# Patient Record
Sex: Female | Born: 1975
Health system: Southern US, Community
[De-identification: ages and names within clinical notes are randomized; demographics above are authoritative.]

## PROBLEM LIST (undated history)

## (undated) DIAGNOSIS — I10 Essential (primary) hypertension: Secondary | ICD-10-CM

## (undated) DIAGNOSIS — E7800 Pure hypercholesterolemia, unspecified: Secondary | ICD-10-CM

## (undated) DIAGNOSIS — F329 Major depressive disorder, single episode, unspecified: Secondary | ICD-10-CM

## (undated) DIAGNOSIS — F32A Depression, unspecified: Secondary | ICD-10-CM

## (undated) HISTORY — DX: Depression, unspecified: F32.A

## (undated) HISTORY — PX: LAPAROSCOPIC OVARIAN: SHX5906

---
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
## 2004-11-27 ENCOUNTER — Observation Stay: Payer: Self-pay

## 2004-12-01 ENCOUNTER — Inpatient Hospital Stay: Payer: Self-pay | Admitting: Unknown Physician Specialty

## 2005-11-11 ENCOUNTER — Emergency Department: Payer: Self-pay | Admitting: Emergency Medicine

## 2006-01-26 ENCOUNTER — Ambulatory Visit: Payer: Self-pay | Admitting: Urology

## 2007-08-30 ENCOUNTER — Emergency Department: Payer: Self-pay | Admitting: Emergency Medicine

## 2007-11-01 ENCOUNTER — Ambulatory Visit: Payer: Self-pay | Admitting: Unknown Physician Specialty

## 2007-11-04 ENCOUNTER — Ambulatory Visit: Payer: Self-pay | Admitting: Unknown Physician Specialty

## 2008-04-16 IMAGING — CT CT STONE STUDY
1 of 2 series · 15 of 32 positions shown, 19 images · non-contrast
Comparison: none

REASON FOR EXAM: ABDOMINAL PAIN  RM 6
COMMENTS:

[Series 2: soft tissue · axial · 0.79mm/px · z∈[-798,-402]mm · 15 of 149 slices shown, 19 images]
[im 11/149  soft-tissue]
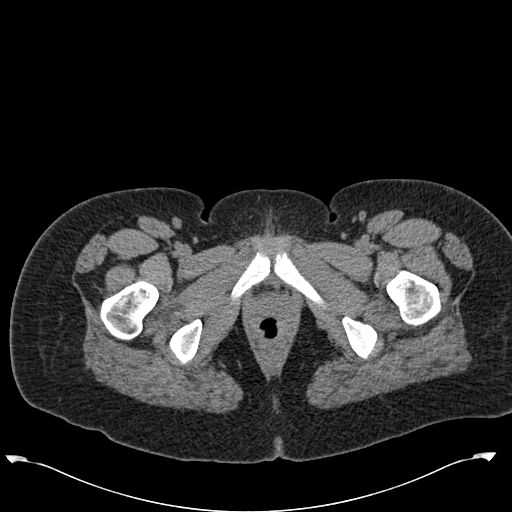
[im 11/149  bone]
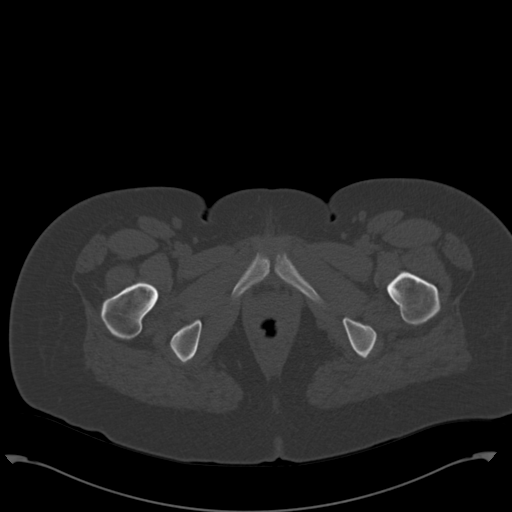
[im 21/149  soft-tissue]
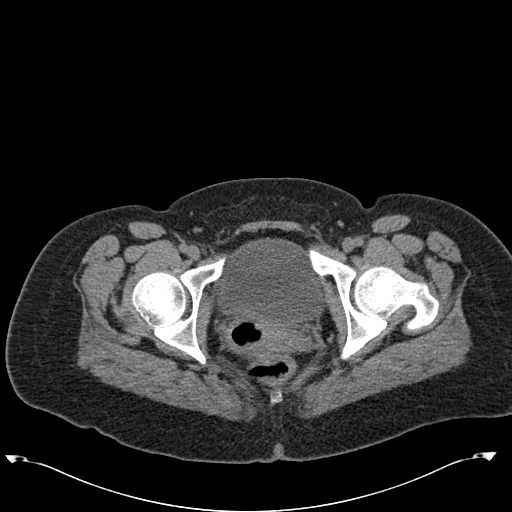
[im 31/149  soft-tissue]
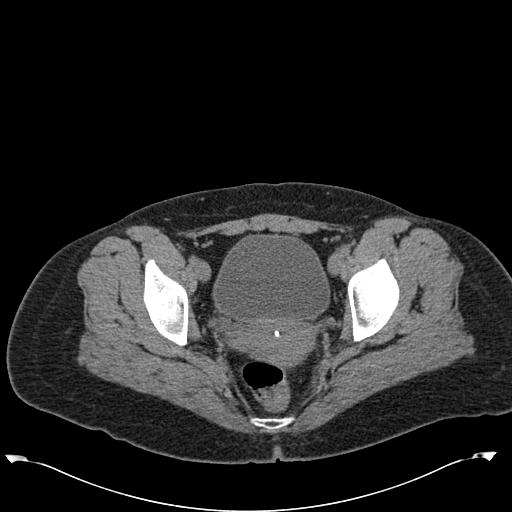
[im 41/149  soft-tissue]
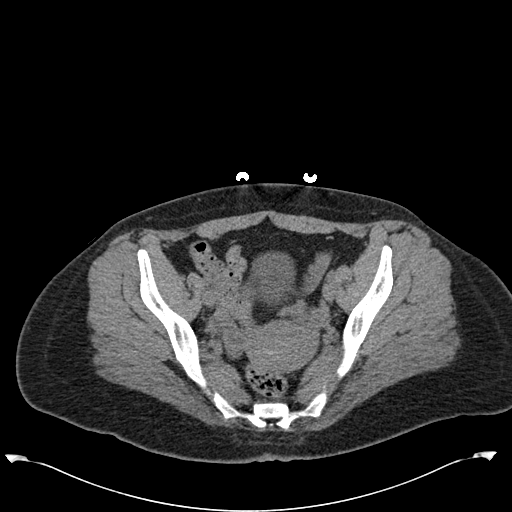
[im 52/149  soft-tissue]
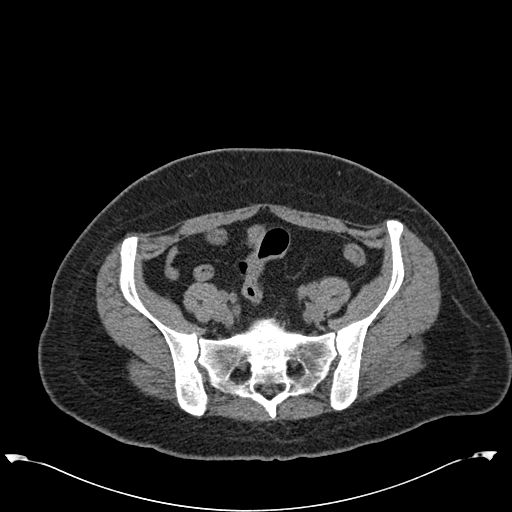
[im 62/149  soft-tissue]
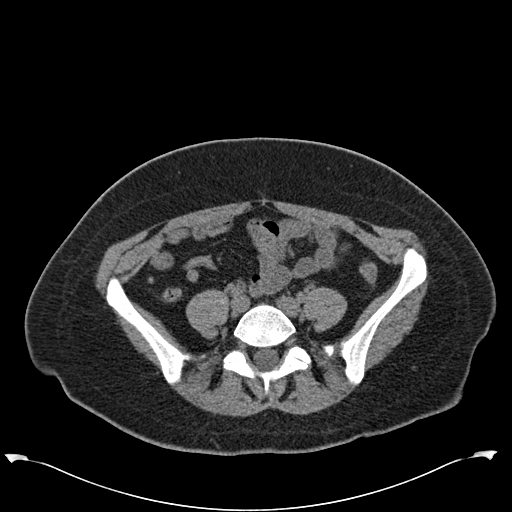
[im 77/149  soft-tissue]
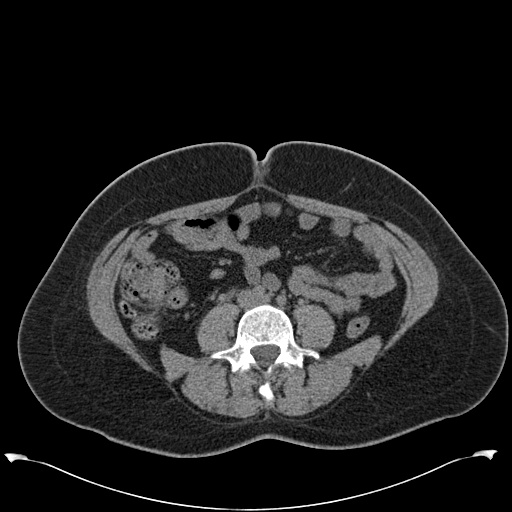
[im 87/149  soft-tissue]
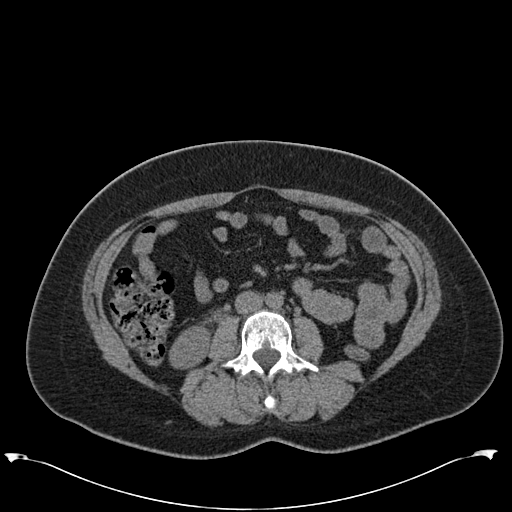
[im 97/149  soft-tissue]
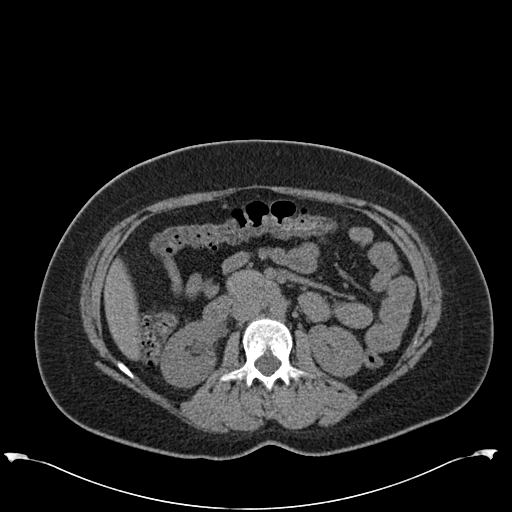
[im 97/149  bone]
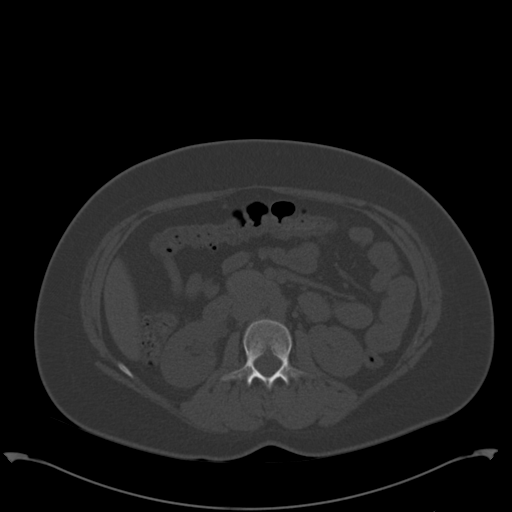
[im 108/149  soft-tissue]
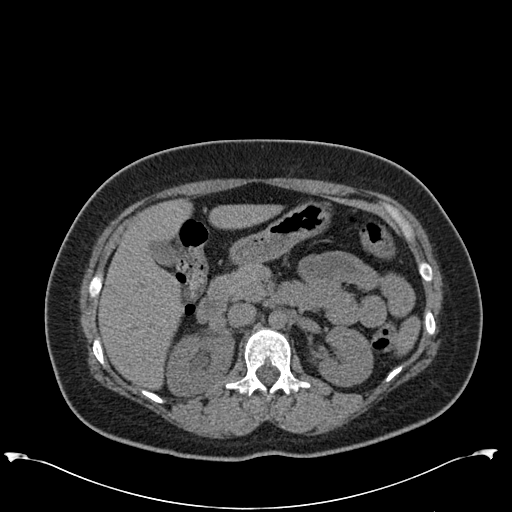
[im 118/149  soft-tissue]
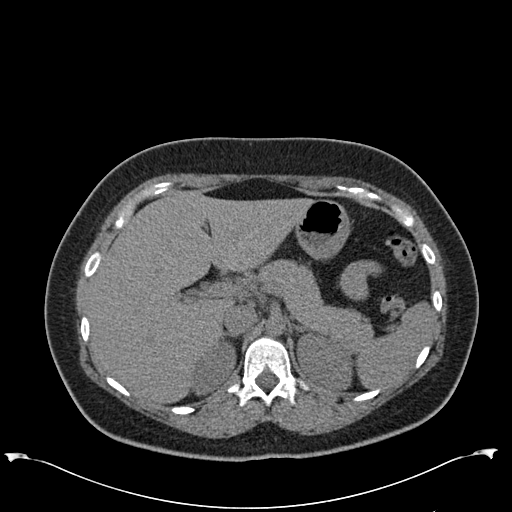
[im 128/149  soft-tissue]
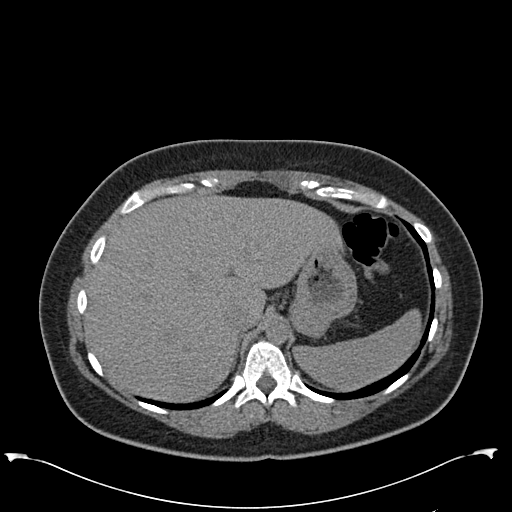
[im 128/149  lung]
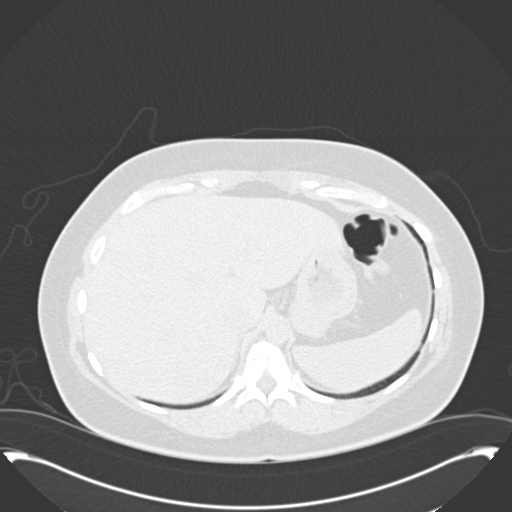
[im 133/149  lung]
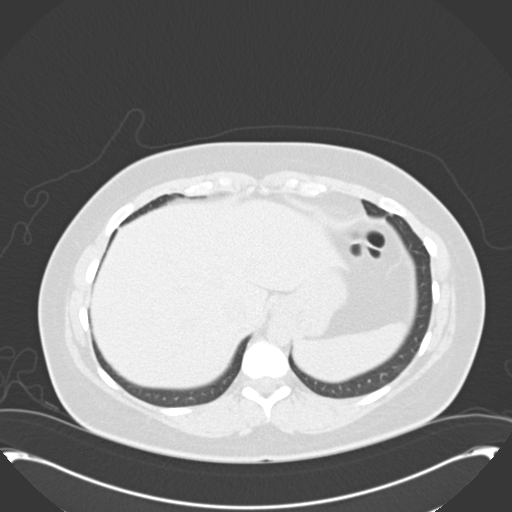
[im 138/149  soft-tissue]
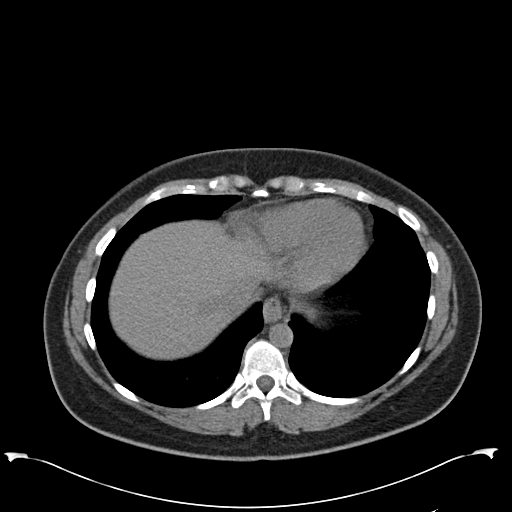
[im 138/149  lung]
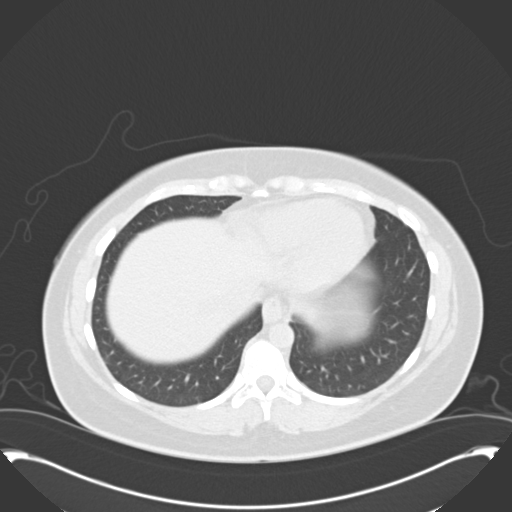
[im 143/149  lung]
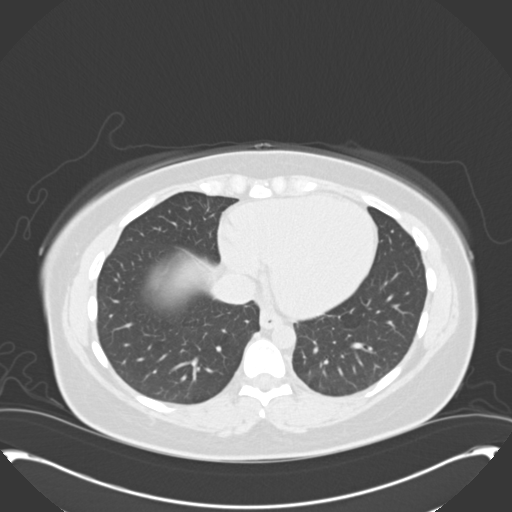

[15 of 32 positions shown; findings below may reference images not displayed]

PROCEDURE:     CT  - CT ABDOMEN /PELVIS WO (STONE)  - November 11, 2005  [DATE]

RESULT:     The patient is complaining of RIGHT flank discomfort and the
study was tailored to evaluate the patient for possible urinary tract
stones.

Both RIGHT and LEFT kidneys demonstrate the presence of stones measuring
between 1 and 3 mm in diameter.  There is mild hydroureter on the RIGHT
secondary to an approximately 2 mm diameter stone seen best on image #128 at
the ureterovesical junction.  I see no stone within the urinary bladder.
There is an IUD device present in the uterus. I see no free fluid in the
pelvis. No adnexal masses are identified. There are a few phleboliths
present.  There is likely a cyst involving the RIGHT ovary.

The unopacified loops of small and large bowel are normal in appearance. The
periaortic and pericaval regions exhibit no acute abnormality. The adrenal
glands, spleen, pancreas, non-distended stomach, and liver exhibit no acute
abnormality. The gallbladder is only partially distended but is grossly
normal. The lung bases are clear.

There is a hypodensity associated with the upper pole of the LEFT kidney
posteriorly with Hounsfield measurement of approximately -23.  This may
reflect fat or a cystic process. No obstruction of the LEFT kidney is seen.
IMPRESSION: 1)There is very mild hydronephrosis and hydroureter on the RIGHT secondary
to a 2 mm diameter stone at the RIGHT ureterovesical junction.

2)Both kidneys demonstrate non-obstructing stones elsewhere measuring
between 1 and 3 mm in diameter.

3)There is a probable RIGHT ovarian cyst.  There is an IUD device in place.
A tampon is also in place.

4)I see no acute abnormality of the bowel nor evidence of acute
hepatobiliary disease.

The findings were called to the [HOSPITAL] the conclusion of
the study.

## 2011-01-20 ENCOUNTER — Ambulatory Visit: Payer: Self-pay | Admitting: Internal Medicine

## 2011-10-27 DIAGNOSIS — E785 Hyperlipidemia, unspecified: Secondary | ICD-10-CM

## 2011-10-27 DIAGNOSIS — I10 Essential (primary) hypertension: Secondary | ICD-10-CM | POA: Insufficient documentation

## 2011-10-27 HISTORY — DX: Hyperlipidemia, unspecified: E78.5

## 2013-01-10 ENCOUNTER — Observation Stay: Payer: Self-pay

## 2013-01-10 LAB — PIH PROFILE
Anion Gap: 9 (ref 7–16)
Creatinine: 1.07 mg/dL (ref 0.60–1.30)
EGFR (African American): >60
Glucose: 79 mg/dL (ref 65–99)
HCT: 32.9 % — ABNORMAL LOW (ref 35.0–47.0)
MCH: 29.7 pg (ref 26.0–34.0)
Osmolality: 279 (ref 275–301)
Platelet: 277 10*3/uL (ref 150–440)
Potassium: 3.5 mmol/L (ref 3.5–5.1)
RBC: 3.8 10*6/uL (ref 3.80–5.20)
Sodium: 141 mmol/L (ref 136–145)
Uric Acid: 8.4 mg/dL — ABNORMAL HIGH (ref 2.6–6.0)
WBC: 10.4 10*3/uL (ref 3.6–11.0)

## 2013-01-10 LAB — PROTEIN / CREATININE RATIO, URINE
Creatinine, Urine: 50.8 mg/dL
Protein, Random Urine: 9 mg/dL
Protein/Creat. Ratio: 177 mg/g{creat}

## 2013-01-13 ENCOUNTER — Inpatient Hospital Stay: Payer: Self-pay | Admitting: Obstetrics and Gynecology

## 2013-06-25 IMAGING — US TRANSABDOMINAL ULTRASOUND OF PELVIS
1 series · 17 of 25 positions shown · non-contrast
Comparison: none

REASON FOR EXAM: RT ovarian cyst
COMMENTS:

[Series 1: transabdominal ultrasound of pelvis · 17 of 78 slices shown]
[im 1/78]
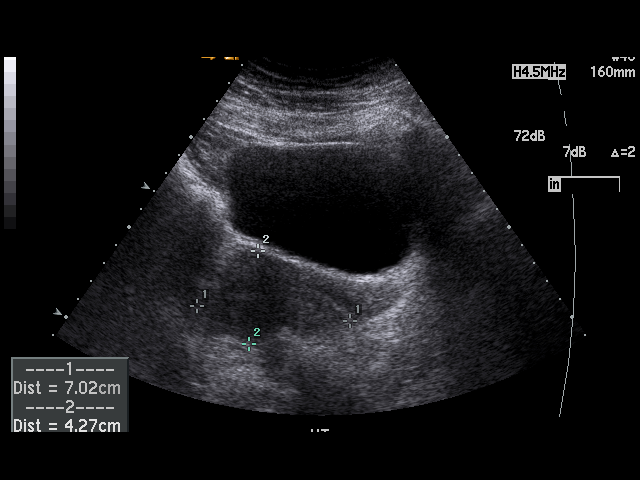
[im 7/78]
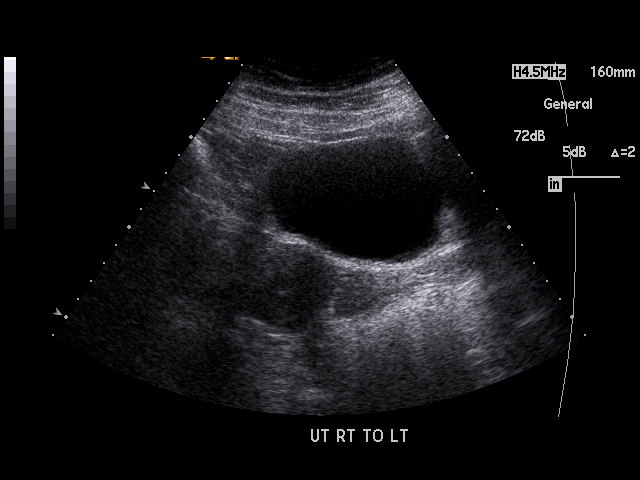
[im 10/78]
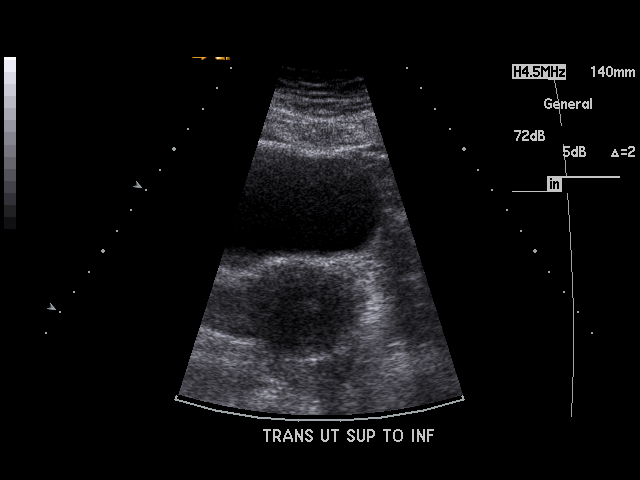
[im 17/78]
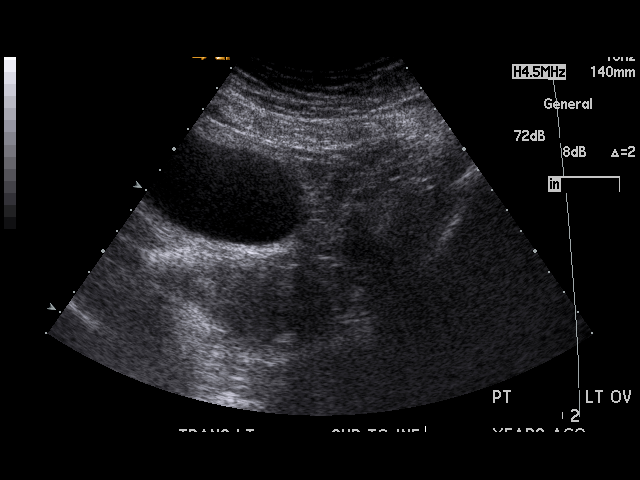
[im 20/78]
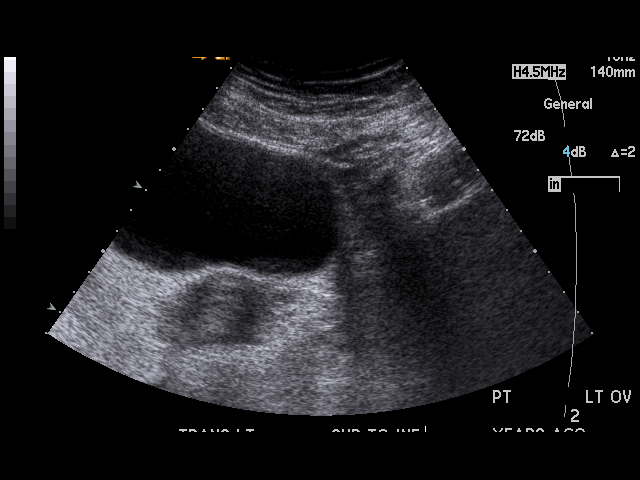
[im 26/78]
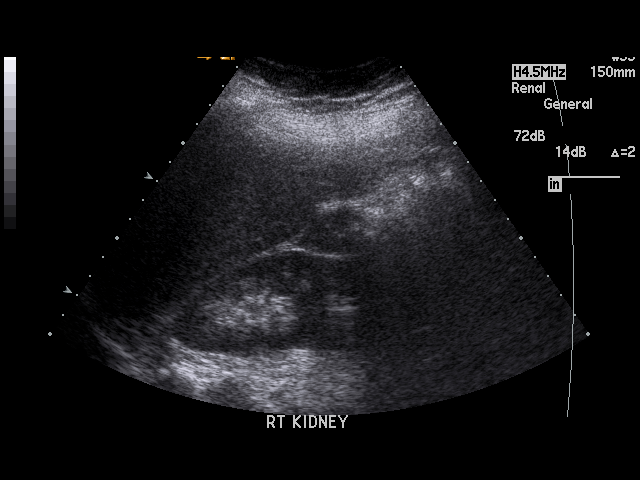
[im 29/78]
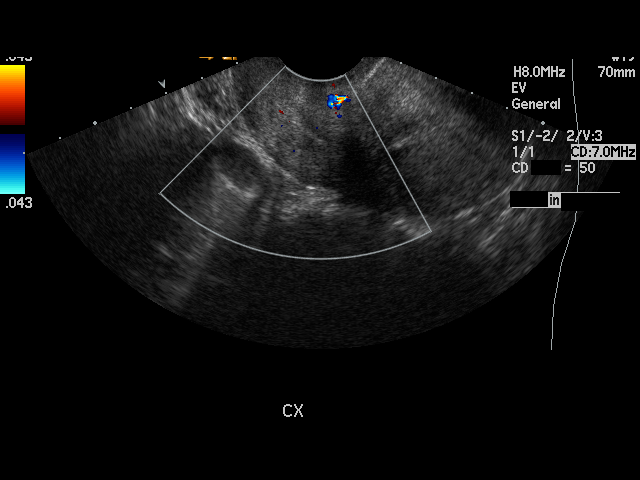
[im 36/78]
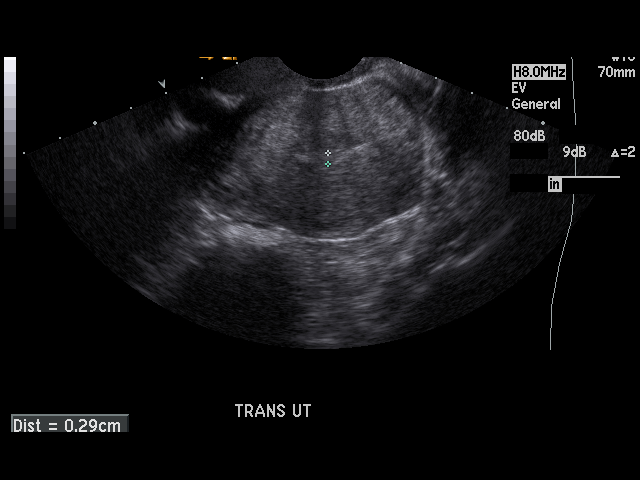
[im 39/78]
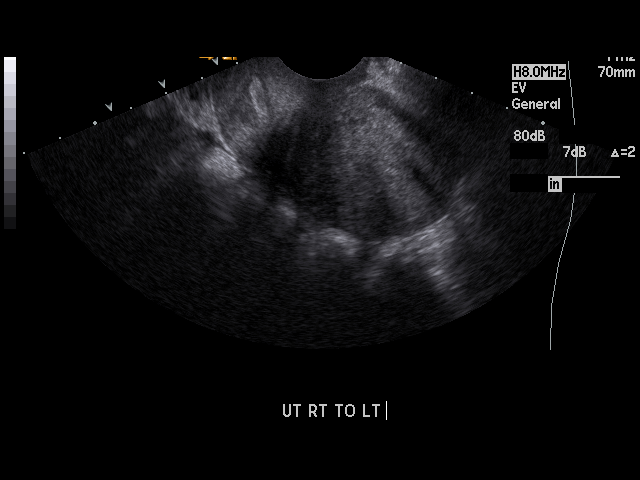
[im 42/78]
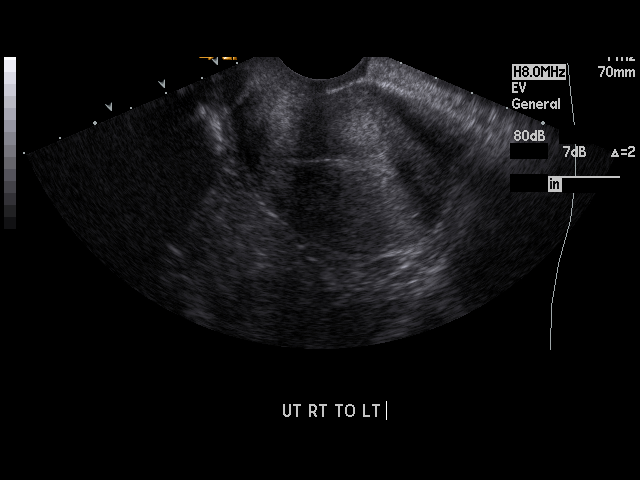
[im 49/78]
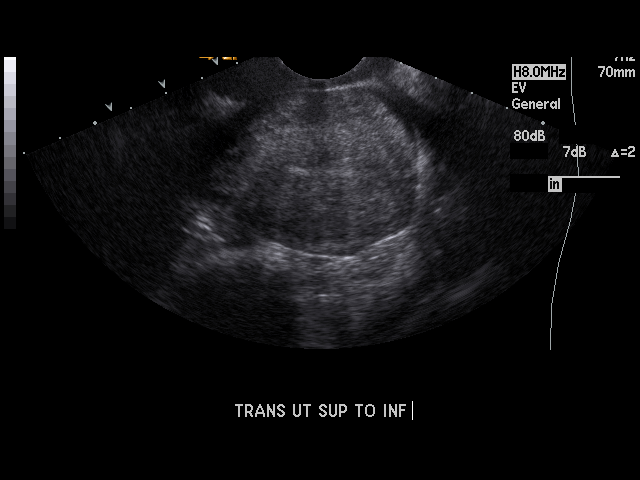
[im 52/78]
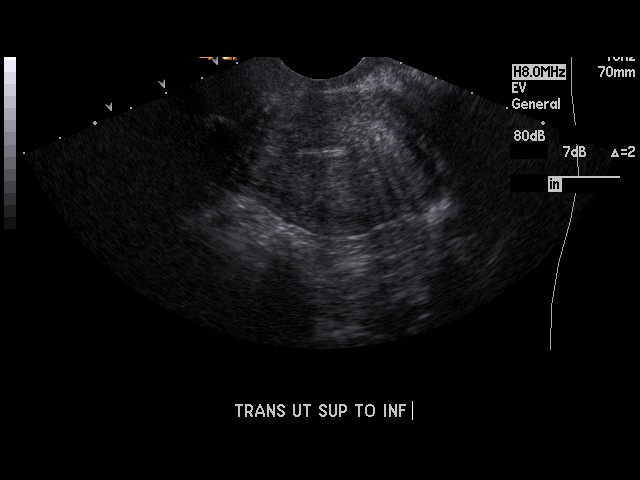
[im 58/78]
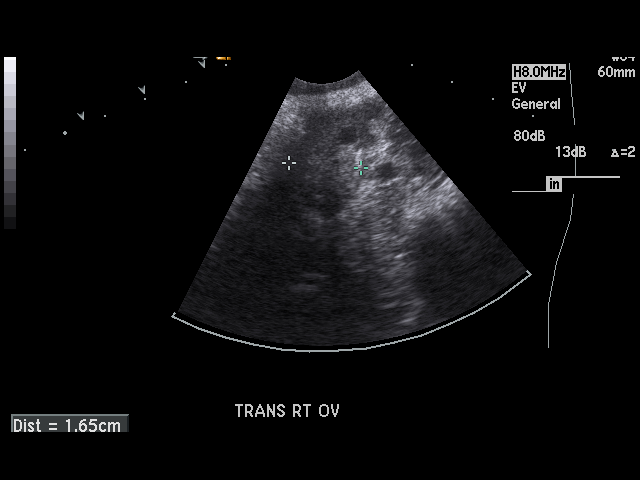
[im 61/78]
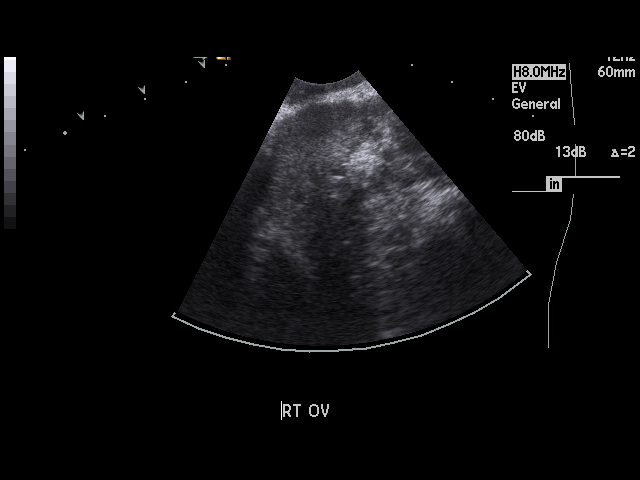
[im 68/78]
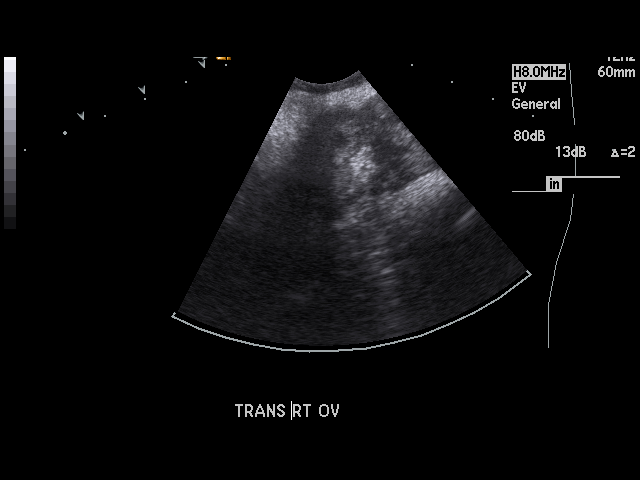
[im 71/78]
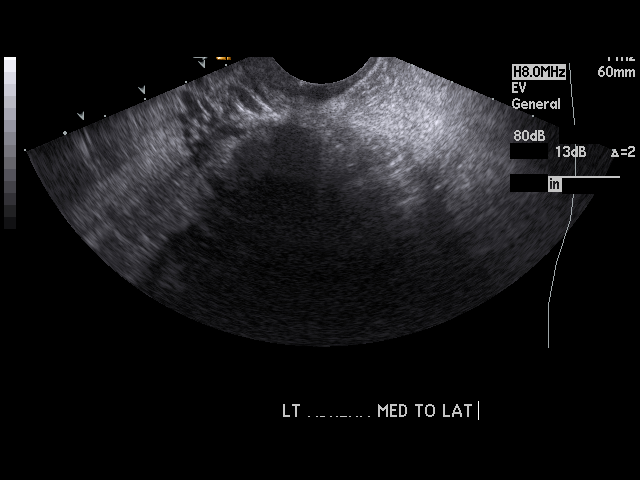
[im 78/78]
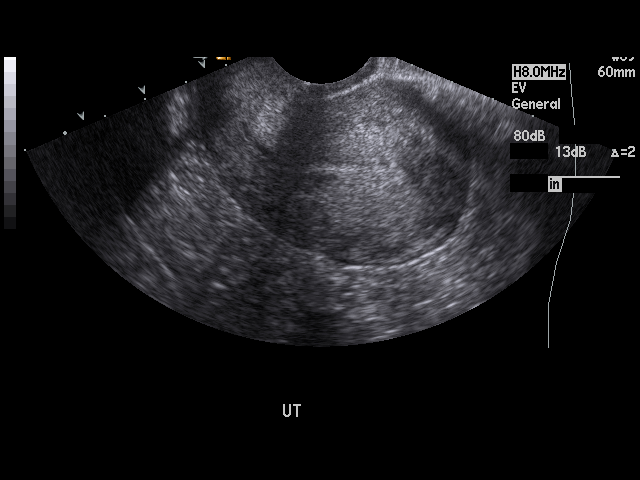

[17 of 25 positions shown; findings below may reference images not displayed]

PROCEDURE:     ANISI - ANISI PELVIS NON-OB W/TRANSVAGINAL  - January 20, 2011  [DATE]

RESULT:     Transabdominal and endovaginal imaging of the pelvis was
obtained.

The uterus measures 7.02 x 4.21 x 5.16 cm and demonstrates a homogeneous
echotexture and endometrial thickness of 3.4 mm. The left ovary has been
surgically removed. The right ovary measures 3.08 x 2.12 x 1.15 cm. The
right ovary demonstrates a echotexture. Color flow is appreciated within the
right ovary. There is no evidence of pelvic free fluid, loculated fluid or
loculated fluid collections or masses.

The uterus is retroverted.
IMPRESSION: Unremarkable pelvic ultrasound.

## 2014-11-14 NOTE — H&P (Signed)
L&D Evaluation:  History Expanded:  HPI 39 yo G3 P2002, with EDD of 01/19/13, [facility] with elevated BPs at office. Denies HA, visual changes, swelling, RUQ pain. Irregular ctx. No LOF or VB. +FM.   Patient's Medical History CHTN - resolved during pregnancy, obesity, AMA   Patient's Surgical History LSO, cystectomy   Medications Pre Serbiaatal Vitamins   Social History erythromycin   ROS:  ROS see HPI   Exam:  Vital Signs stable   General no apparent distress   Mental Status clear   Chest clear   Heart no murmur/gallop/rubs   Abdomen gravid, non-tender   Estimated Fetal Weight Average for gestational age   Edema no edema   Reflexes 1+   Clonus negative   Pelvic no external lesions, 2/60/-2, posterior   Mebranes Intact   FHT normal rate with no decels, category 1 tracing   Ucx irregular   Other Labs: H&H: 11.3 & 32.9, Plt 277, Uric Acid 8.4, SGOT 13, PC Ratio: 177   Impression:  Impression evaluation for PIH   Plan:  Comments Normotensive with rest - will begin maternity leave now. Return to office on 7/9 for BP check & discuss IOL d/t CHTN. Pre-e & labor precautions.   Electronic Signatures: Vella KohlerBrothers, Mahalia Dykes K (CNM)  (Signed 07-Jul-14 13:07)  Authored: L&D Evaluation   Last Updated: 07-Jul-14 13:07 by Vella KohlerBrothers, Eliott Amparan K (CNM)

## 2017-03-27 DIAGNOSIS — E669 Obesity, unspecified: Secondary | ICD-10-CM

## 2017-03-27 DIAGNOSIS — F332 Major depressive disorder, recurrent severe without psychotic features: Secondary | ICD-10-CM

## 2017-03-27 DIAGNOSIS — Z0000 Encounter for general adult medical examination without abnormal findings: Secondary | ICD-10-CM

## 2017-03-27 HISTORY — DX: Major depressive disorder, recurrent severe without psychotic features: F33.2

## 2017-03-27 HISTORY — DX: Obesity, unspecified: E66.9

## 2017-03-27 HISTORY — DX: Encounter for general adult medical examination without abnormal findings: Z00.00

## 2017-04-06 DIAGNOSIS — F902 Attention-deficit hyperactivity disorder, combined type: Secondary | ICD-10-CM

## 2017-04-06 HISTORY — DX: Attention-deficit hyperactivity disorder, combined type: F90.2

## 2018-08-11 ENCOUNTER — Encounter: Payer: Self-pay | Admitting: Physician Assistant

## 2018-08-11 ENCOUNTER — Ambulatory Visit (INDEPENDENT_AMBULATORY_CARE_PROVIDER_SITE_OTHER): Payer: Self-pay | Admitting: Physician Assistant

## 2018-08-11 VITALS — BP 150/98 | HR 86 | Temp 98.3°F | Resp 18 | Wt 224.0 lb

## 2018-08-11 DIAGNOSIS — I10 Essential (primary) hypertension: Secondary | ICD-10-CM

## 2018-08-11 MED ORDER — AMLODIPINE BESYLATE 5 MG PO TABS: 5.0000 mg | ORAL_TABLET | Freq: Every day | ORAL | 0 refills | Status: DC

## 2018-08-11 NOTE — Patient Instructions (Addendum)
Managing Your Hypertension   Start amlodipine 5mg  daily. We do not obtain blood work in our office or provide additional refills for blood pressure medication so you will have to establish with a family doctor for this.   I do recommend frequent monitoring of blood pressure outside of the office. It is best if you check the blood pressure at different times in the day. Your goal is <140/90. If your values are consistently above this goal, please go to urgent care.   If you start to have chest pain, blurred vision, shortness of breath, severe headache, lower leg swelling, or nausea/vomiting please seek care immediately at the ED.     Hypertension is commonly called high blood pressure. This is when the force of your blood pressing against the walls of your arteries is too strong. Arteries are blood vessels that carry blood from your heart throughout your body. Hypertension forces the heart to work harder to pump blood, and may cause the arteries to become narrow or stiff. Having untreated or uncontrolled hypertension can cause heart attack, stroke, kidney disease, and other problems. What are blood pressure readings? A blood pressure reading consists of a higher number over a lower number. Ideally, your blood pressure should be below 120/80. The first ("top") number is called the systolic pressure. It is a measure of the pressure in your arteries as your heart beats. The second ("bottom") number is called the diastolic pressure. It is a measure of the pressure in your arteries as the heart relaxes. What does my blood pressure reading mean? Blood pressure is classified into four stages. Based on your blood pressure reading, your health care provider may use the following stages to determine what type of treatment you need, if any. Systolic pressure and diastolic pressure are measured in a unit called mm Hg. Normal  Systolic pressure: below 120.  Diastolic pressure: below 80. Elevated  Systolic  pressure: 120-129.  Diastolic pressure: below 80. Hypertension stage 1  Systolic pressure: 130-139.  Diastolic pressure: 80-89. Hypertension stage 2  Systolic pressure: 140 or above.  Diastolic pressure: 90 or above. What health risks are associated with hypertension? Managing your hypertension is an important responsibility. Uncontrolled hypertension can lead to:  A heart attack.  A stroke.  A weakened blood vessel (aneurysm).  Heart failure.  Kidney damage.  Eye damage.  Metabolic syndrome.  Memory and concentration problems. What changes can I make to manage my hypertension? Hypertension can be managed by making lifestyle changes and possibly by taking medicines. Your health care provider will help you make a plan to bring your blood pressure within a normal range. Eating and drinking   Eat a diet that is high in fiber and potassium, and low in salt (sodium), added sugar, and fat. An example eating plan is called the DASH (Dietary Approaches to Stop Hypertension) diet. To eat this way: ? Eat plenty of fresh fruits and vegetables. Try to fill half of your plate at each meal with fruits and vegetables. ? Eat whole grains, such as whole wheat pasta, brown rice, or whole grain bread. Fill about one quarter of your plate with whole grains. ? Eat low-fat diary products. ? Avoid fatty cuts of meat, processed or cured meats, and poultry with skin. Fill about one quarter of your plate with lean proteins such as fish, chicken without skin, beans, eggs, and tofu. ? Avoid premade and processed foods. These tend to be higher in sodium, added sugar, and fat.  Reduce your daily  sodium intake. Most people with hypertension should eat less than 1,500 mg of sodium a day.  Limit alcohol intake to no more than 1 drink a day for nonpregnant women and 2 drinks a day for men. One drink equals 12 oz of beer, 5 oz of wine, or 1 oz of hard liquor. Lifestyle  Work with your health care  provider to maintain a healthy body weight, or to lose weight. Ask what an ideal weight is for you.  Get at least 30 minutes of exercise that causes your heart to beat faster (aerobic exercise) most days of the week. Activities may include walking, swimming, or biking.  Include exercise to strengthen your muscles (resistance exercise), such as weight lifting, as part of your weekly exercise routine. Try to do these types of exercises for 30 minutes at least 3 days a week.  Do not use any products that contain nicotine or tobacco, such as cigarettes and e-cigarettes. If you need help quitting, ask your health care provider.  Control any long-term (chronic) conditions you have, such as high cholesterol or diabetes. Monitoring  Monitor your blood pressure at home as told by your health care provider. Your personal target blood pressure may vary depending on your medical conditions, your age, and other factors.  Have your blood pressure checked regularly, as often as told by your health care provider. Working with your health care provider  Review all the medicines you take with your health care provider because there may be side effects or interactions.  Talk with your health care provider about your diet, exercise habits, and other lifestyle factors that may be contributing to hypertension.  Visit your health care provider regularly. Your health care provider can help you create and adjust your plan for managing hypertension. Will I need medicine to control my blood pressure? Your health care provider may prescribe medicine if lifestyle changes are not enough to get your blood pressure under control, and if:  Your systolic blood pressure is 130 or higher.  Your diastolic blood pressure is 80 or higher. Take medicines only as told by your health care provider. Follow the directions carefully. Blood pressure medicines must be taken as prescribed. The medicine does not work as well when you  skip doses. Skipping doses also puts you at risk for problems. Contact a health care provider if:  You think you are having a reaction to medicines you have taken.  You have repeated (recurrent) headaches.  You feel dizzy.  You have swelling in your ankles.  You have trouble with your vision. Get help right away if:  You develop a severe headache or confusion.  You have unusual weakness or numbness, or you feel faint.  You have severe pain in your chest or abdomen.  You vomit repeatedly.  You have trouble breathing. Summary  Hypertension is when the force of blood pumping through your arteries is too strong. If this condition is not controlled, it may put you at risk for serious complications.  Your personal target blood pressure may vary depending on your medical conditions, your age, and other factors. For most people, a normal blood pressure is less than 120/80.  Hypertension is managed by lifestyle changes, medicines, or both. Lifestyle changes include weight loss, eating a healthy, low-sodium diet, exercising more, and limiting alcohol. This information is not intended to replace advice given to you by your health care provider. Make sure you discuss any questions you have with your health care provider. Document Released: 03/17/2012  Document Revised: 05/21/2016 Document Reviewed: 05/21/2016 Elsevier Interactive Patient Education  [employer].

## 2018-08-11 NOTE — Progress Notes (Signed)
MRN: [id number] DOB: 10-04-1975  Subjective:   Molly Summers is a 43 y.o. female presenting for follow up on Hypertension.   She was referred to [place] by health at work for elevated bp reading of 162/101 earlier today. She has a known PMH of HTN, was taking amlodipine 5mg  but stopped in 03/2018 because she lost her insurance and did not want to find a new family doctor.  She has been checking her blood pressure at home regularly over the past few months.  It typically is running in the 150s systolically. Denies lightheadedness, dizziness, chronic headache, double vision, chest pain, shortness of breath, heart racing, palpitations, nausea, vomiting, abdominal pain, hematuria, lower leg swelling. Lifestyle: Trying to focus on a low-carb diet.  Avoiding excessive salt intake.  Has not started exercising but is interested in starting to go to the gym.  Wants to lose weight before she goes to [employer].  Denies smoking.  Denies past medical history of heart disease, MI, stroke, thyroid disorder, diabetes, and kidney disease.  Has PSH of uterine ablation, does not have menstrual cycles anymore.  Has family history of hypertension in multiple family members.  Denies any other aggravating or relieving factors, no other questions or concerns.  Molly Summers has a current medication list which includes the following prescription(s): aripiprazole. Also has no allergies on file.  Molly Summers  has no past medical history on file. Also  has no past surgical history on file.   Objective:   Vitals: BP (!) 155/98 (BP Location: Right Arm, Patient Position: Sitting, Cuff Size: Large)   Pulse 86   Temp 98.3 F (36.8 C) (Oral)   Resp 18   Wt 224 lb (101.6 kg)   SpO2 98%   Physical Exam Vitals signs reviewed.  Constitutional:      General: She is not in acute distress.    Appearance: She is well-developed. She is not toxic-appearing or diaphoretic.  HENT:     Head: Normocephalic and atraumatic.     Mouth/Throat:   Pharynx: Uvula midline.  Eyes:     Extraocular Movements: Extraocular movements intact.     Conjunctiva/sclera: Conjunctivae normal.     Pupils: Pupils are equal, round, and reactive to light.     Funduscopic exam:    Right eye: No AV nicking or papilledema. Red reflex present.        Left eye: No AV nicking or papilledema. Red reflex present. Neck:     Musculoskeletal: Normal range of motion.  Cardiovascular:     Rate and Rhythm: Normal rate and regular rhythm.     Heart sounds: Normal heart sounds.  Pulmonary:     Effort: Pulmonary effort is normal.     Breath sounds: Normal breath sounds. No wheezing, rhonchi or rales.  Musculoskeletal:     Right lower leg: She exhibits no swelling.     Left lower leg: She exhibits no swelling.  Skin:    General: Skin is warm and dry.  Neurological:     Mental Status: She is alert and oriented to person, place, and time.     Cranial Nerves: Cranial nerves are intact.     Coordination: Finger-Nose-Finger Test normal.     No results found for this or any previous visit (from the past 24 hour(s)).  Assessment and Plan :  1. Hypertension, unspecified type Patient with known history of hypertension.  Has tolerated amlodipine 5 mg well in the past-has been out of medication for about 5 months.  She  is asymptomatic today.  No acute findings on physical exam.  Recommend restarting amlodipine today and following up with family doctor for further evaluation and management.  Advised to continue to check blood pressure while outside the office. Follow-up with urgent care if BP remains elevated with use of medication.  Given strict ED precautions.  Patient voices understanding. - amLODipine (NORVASC) 5 MG tablet; Take 1 tablet (5 mg total) by mouth daily.  Dispense: 30 tablet; Refill: 0   Benjiman Core, PA-C  [facility] 08/11/2018 11:05 AM

## 2018-08-13 ENCOUNTER — Telehealth: Payer: Self-pay

## 2018-08-13 NOTE — Telephone Encounter (Signed)
Phone number on file is not in service.

## 2018-09-07 ENCOUNTER — Other Ambulatory Visit: Payer: Self-pay | Admitting: Physician Assistant

## 2018-09-07 DIAGNOSIS — I10 Essential (primary) hypertension: Secondary | ICD-10-CM

## 2018-12-29 ENCOUNTER — Ambulatory Visit (INDEPENDENT_AMBULATORY_CARE_PROVIDER_SITE_OTHER): Payer: Self-pay | Admitting: Family Medicine

## 2018-12-29 ENCOUNTER — Other Ambulatory Visit: Payer: Self-pay

## 2018-12-29 DIAGNOSIS — U071 COVID-19: Secondary | ICD-10-CM

## 2018-12-29 MED ORDER — BENZONATATE 100 MG PO CAPS: 100.0000 mg | ORAL_CAPSULE | Freq: Three times a day (TID) | ORAL | 0 refills | Status: DC | PRN

## 2018-12-29 NOTE — Progress Notes (Signed)
Patient ID: Molly Summers, female   DOB: 04-Apr-1976, 43 y.o.   MRN: [id number]  This visit type was conducted due to national recommendations for restrictions regarding the COVID-19 pandemic in an effort to limit this patient's exposure and mitigate transmission in our community.   Virtual Visit via Video Note  I connected with Carlis StableAmanda Soth on 12/29/18 at  1:15 PM EDT by a video enabled telemedicine application and verified that I am speaking with the correct person using two identifiers.  Location patient: home Location provider:work or home office Persons participating in the virtual visit: patient, provider  I discussed the limitations of evaluation and management by telemedicine and the availability of in person appointments. The patient expressed understanding and agreed to proceed.   HPI:  Patient has COVID-19 infection.  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  She had onset of symptoms almost 2 weeks ago.  She was tested 8 days ago on Tuesday and received positive results 1 week ago on Wednesday.  She had low-grade fever over the weekend but past couple days up to 102.9.  She had some fatigue.  No body aches.  No nausea or vomiting.  She has taken Tylenol and ibuprofen for her fever.  She denies any dyspnea but does have fairly severe cough which is interfering with sleep.  No history of asthma or any chronic lung issues.  Her husband also tested positive over week ago but he has had very mild symptoms which are already improving.  She is obviously quarantined from work at this time.  She has had some mild loss of taste and smell.  She denies any respiratory distress at rest or with simple activities such as walking.  She does have underlying comorbidities of hypertension and obesity.  ROS: See pertinent positives and negatives per HPI.  No past medical history on file.  No past surgical history on file.  No family history on file.  SOCIAL HX:  Non-smoker   Current Outpatient Medications:  .  amLODipine (NORVASC) 5 MG tablet, Take 1 tablet (5 mg total) by mouth daily., Disp: 30 tablet, Rfl: 0 .  ARIPiprazole (ABILIFY) 5 MG tablet, Take 5 mg by mouth daily., Disp: , Rfl:  .  benzonatate (TESSALON) 100 MG capsule, Take 1 capsule (100 mg total) by mouth 3 (three) times daily as needed for cough., Disp: 30 capsule, Rfl: 0  EXAM:  VITALS per patient if applicable:  GENERAL: alert, oriented, appears well and in no acute distress  HEENT: atraumatic, conjunttiva clear, no obvious abnormalities on inspection of external nose and ears  NECK: normal movements of the head and neck  LUNGS: on inspection no signs of respiratory distress, breathing rate appears normal, no obvious gross SOB, gasping or wheezing  CV: no obvious cyanosis  MS: moves all visible extremities without noticeable abnormality  PSYCH/NEURO: pleasant and cooperative, no obvious depression or anxiety, speech and thought processing grossly intact  ASSESSMENT AND PLAN:  Discussed the following assessment and plan:  COVID-19 virus infection-patient is currently in no respiratory distress though her cough is fairly intense at times.  -Symptomatic treatment with fluids and rest -Patient knows to call 911 or go promptly to the ER for any worsening issues with her breathing -Tessalon Perles 100 mg every 8 hours as needed for cough -She knows to stay quarantined until at least 3 days after her symptoms resolve -We will call to check on her tomorrow to see how she is progressing    I  discussed the assessment and treatment plan with the patient. The patient was provided an opportunity to ask questions and all were answered. The patient agreed with the plan and demonstrated an understanding of the instructions.   The patient was advised to call back or seek an in-person evaluation if the symptoms worsen or if the condition fails to improve as anticipated.   Carolann Littler, MD

## 2018-12-30 ENCOUNTER — Encounter (HOSPITAL_COMMUNITY): Payer: Self-pay

## 2018-12-30 ENCOUNTER — Other Ambulatory Visit: Payer: Self-pay

## 2018-12-30 ENCOUNTER — Emergency Department (HOSPITAL_COMMUNITY): Payer: 59

## 2018-12-30 ENCOUNTER — Inpatient Hospital Stay (HOSPITAL_COMMUNITY)
Admission: EM | Admit: 2018-12-30 | Discharge: 2019-01-03 | DRG: 177 | Disposition: A | Discharge status: Home / Self Care | Payer: 59 | Attending: Internal Medicine | Admitting: Internal Medicine

## 2018-12-30 DIAGNOSIS — J9621 Acute and chronic respiratory failure with hypoxia: Secondary | ICD-10-CM | POA: Diagnosis not present

## 2018-12-30 DIAGNOSIS — Z6839 Body mass index (BMI) 39.0-39.9, adult: Secondary | ICD-10-CM

## 2018-12-30 DIAGNOSIS — J189 Pneumonia, unspecified organism: Secondary | ICD-10-CM

## 2018-12-30 DIAGNOSIS — J069 Acute upper respiratory infection, unspecified: Secondary | ICD-10-CM | POA: Diagnosis not present

## 2018-12-30 DIAGNOSIS — R000 Tachycardia, unspecified: Secondary | ICD-10-CM | POA: Diagnosis not present

## 2018-12-30 DIAGNOSIS — K219 Gastro-esophageal reflux disease without esophagitis: Secondary | ICD-10-CM | POA: Diagnosis present

## 2018-12-30 DIAGNOSIS — Z9114 Patient's other noncompliance with medication regimen: Secondary | ICD-10-CM | POA: Diagnosis not present

## 2018-12-30 DIAGNOSIS — J9601 Acute respiratory failure with hypoxia: Secondary | ICD-10-CM | POA: Diagnosis present

## 2018-12-30 DIAGNOSIS — E785 Hyperlipidemia, unspecified: Secondary | ICD-10-CM | POA: Diagnosis present

## 2018-12-30 DIAGNOSIS — J1289 Other viral pneumonia: Secondary | ICD-10-CM | POA: Diagnosis present

## 2018-12-30 DIAGNOSIS — I493 Ventricular premature depolarization: Secondary | ICD-10-CM | POA: Diagnosis present

## 2018-12-30 DIAGNOSIS — E7800 Pure hypercholesterolemia, unspecified: Secondary | ICD-10-CM | POA: Diagnosis present

## 2018-12-30 DIAGNOSIS — A419 Sepsis, unspecified organism: Secondary | ICD-10-CM | POA: Diagnosis not present

## 2018-12-30 DIAGNOSIS — Y92239 Unspecified place in hospital as the place of occurrence of the external cause: Secondary | ICD-10-CM | POA: Diagnosis not present

## 2018-12-30 DIAGNOSIS — I10 Essential (primary) hypertension: Secondary | ICD-10-CM | POA: Diagnosis present

## 2018-12-30 DIAGNOSIS — U071 COVID-19: Principal | ICD-10-CM | POA: Diagnosis present

## 2018-12-30 DIAGNOSIS — Z79899 Other long term (current) drug therapy: Secondary | ICD-10-CM

## 2018-12-30 DIAGNOSIS — T380X5A Adverse effect of glucocorticoids and synthetic analogues, initial encounter: Secondary | ICD-10-CM | POA: Diagnosis not present

## 2018-12-30 DIAGNOSIS — I472 Ventricular tachycardia: Secondary | ICD-10-CM | POA: Diagnosis present

## 2018-12-30 DIAGNOSIS — R0602 Shortness of breath: Secondary | ICD-10-CM | POA: Diagnosis not present

## 2018-12-30 HISTORY — DX: Pure hypercholesterolemia, unspecified: E78.00

## 2018-12-30 HISTORY — DX: Essential (primary) hypertension: I10

## 2018-12-30 HISTORY — DX: COVID-19: U07.1

## 2018-12-30 LAB — CBC WITH DIFFERENTIAL/PLATELET
Abs Immature Granulocytes: 0.04 10*3/uL (ref 0.00–0.07)
Basophils Absolute: 0 10*3/uL (ref 0.0–0.1)
Basophils Relative: 0 %
Eosinophils Absolute: 0 10*3/uL (ref 0.0–0.5)
Eosinophils Relative: 0 %
HCT: 43 % (ref 36.0–46.0)
Hemoglobin: 14.2 g/dL (ref 12.0–15.0)
Immature Granulocytes: 1 %
Lymphocytes Relative: 13 %
Lymphs Abs: 0.9 10*3/uL (ref 0.7–4.0)
MCH: 29.6 pg (ref 26.0–34.0)
MCHC: 33 g/dL (ref 30.0–36.0)
MCV: 89.6 fL (ref 80.0–100.0)
Monocytes Absolute: 0.5 10*3/uL (ref 0.1–1.0)
Monocytes Relative: 7 %
Neutro Abs: 5.5 10*3/uL (ref 1.7–7.7)
Neutrophils Relative %: 79 %
Platelets: 290 10*3/uL (ref 150–400)
RBC: 4.8 MIL/uL (ref 3.87–5.11)
RDW: 12.5 % (ref 11.5–15.5)
WBC: 7 10*3/uL (ref 4.0–10.5)
nRBC: 0 % (ref 0.0–0.2)

## 2018-12-30 LAB — COMPREHENSIVE METABOLIC PANEL
ALT: 76 U/L — ABNORMAL HIGH (ref 0–44)
AST: 43 U/L — ABNORMAL HIGH (ref 15–41)
Albumin: 3.1 g/dL — ABNORMAL LOW (ref 3.5–5.0)
Alkaline Phosphatase: 61 U/L (ref 38–126)
Anion gap: 9 (ref 5–15)
BUN: 6 mg/dL (ref 6–20)
CO2: 24 mmol/L (ref 22–32)
Calcium: 8.3 mg/dL — ABNORMAL LOW (ref 8.9–10.3)
Chloride: 104 mmol/L (ref 98–111)
Creatinine, Ser: 0.85 mg/dL (ref 0.44–1.00)
GFR calc Af Amer: >60 mL/min (ref >60)
GFR calc non Af Amer: >60 mL/min (ref >60)
Glucose, Bld: 111 mg/dL — ABNORMAL HIGH (ref 70–99)
Potassium: 3.6 mmol/L (ref 3.5–5.1)
Sodium: 137 mmol/L (ref 135–145)
Total Bilirubin: 0.4 mg/dL (ref 0.3–1.2)
Total Protein: 7.1 g/dL (ref 6.5–8.1)

## 2018-12-30 LAB — URINALYSIS, ROUTINE W REFLEX MICROSCOPIC
Bilirubin Urine: NEGATIVE
Glucose, UA: NEGATIVE mg/dL
Hgb urine dipstick: NEGATIVE
Ketones, ur: NEGATIVE mg/dL
Leukocytes,Ua: NEGATIVE
Nitrite: NEGATIVE
Protein, ur: NEGATIVE mg/dL
Specific Gravity, Urine: 1.008 (ref 1.005–1.030)
pH: 6 (ref 5.0–8.0)

## 2018-12-30 LAB — LACTATE DEHYDROGENASE: LDH: 570 U/L — ABNORMAL HIGH (ref 98–192)

## 2018-12-30 LAB — PROCALCITONIN: Procalcitonin: <0.1 ng/mL

## 2018-12-30 LAB — C-REACTIVE PROTEIN: CRP: 12.9 mg/dL — ABNORMAL HIGH (ref <1.0)

## 2018-12-30 LAB — SEDIMENTATION RATE: Sed Rate: 40 mm/hr — ABNORMAL HIGH (ref 0–22)

## 2018-12-30 LAB — D-DIMER, QUANTITATIVE: D-Dimer, Quant: 1.53 ug/mL-FEU — ABNORMAL HIGH (ref 0.00–0.50)

## 2018-12-30 LAB — ABO/RH: ABO/RH(D): A NEG

## 2018-12-30 LAB — I-STAT BETA HCG BLOOD, ED (MC, WL, AP ONLY): I-stat hCG, quantitative: <5 m[IU]/mL (ref <5)

## 2018-12-30 LAB — LACTIC ACID, PLASMA
Lactic Acid, Venous: 1.1 mmol/L (ref 0.5–1.9)
Lactic Acid, Venous: 0.7 mmol/L (ref 0.5–1.9)

## 2018-12-30 LAB — BRAIN NATRIURETIC PEPTIDE: B Natriuretic Peptide: 73.4 pg/mL (ref 0.0–100.0)

## 2018-12-30 LAB — FERRITIN: Ferritin: 1161 ng/mL — ABNORMAL HIGH (ref 11–307)

## 2018-12-30 MED ORDER — ENOXAPARIN SODIUM 60 MG/0.6ML ~~LOC~~ SOLN
0.5000 mg/kg | SUBCUTANEOUS | Status: DC
  Administered 2018-12-30 – 2019-01-02 (×4): 50 mg via SUBCUTANEOUS
  Filled 2018-12-30 (×6): qty 0.5

## 2018-12-30 MED ORDER — SODIUM CHLORIDE 0.9 % IV SOLN
500.0000 mg | Freq: Once | INTRAVENOUS | Status: AC
  Administered 2018-12-30: 500 mg via INTRAVENOUS
  Filled 2018-12-30: qty 500

## 2018-12-30 MED ORDER — ORAL CARE MOUTH RINSE
15.0000 mL | Freq: Two times a day (BID) | OROMUCOSAL | Status: DC
  Administered 2018-12-30 – 2019-01-03 (×6): 15 mL via OROMUCOSAL

## 2018-12-30 MED ORDER — SODIUM CHLORIDE 0.9 % IV BOLUS
1000.0000 mL | Freq: Once | INTRAVENOUS | Status: AC
  Administered 2018-12-30: 1000 mL via INTRAVENOUS

## 2018-12-30 MED ORDER — ACETAMINOPHEN 325 MG PO TABS: 650.0000 mg | ORAL_TABLET | Freq: Four times a day (QID) | ORAL | Status: DC | PRN

## 2018-12-30 MED ORDER — BENZONATATE 100 MG PO CAPS
100.0000 mg | ORAL_CAPSULE | Freq: Three times a day (TID) | ORAL | Status: DC | PRN
  Administered 2018-12-30 – 2018-12-31 (×3): 100 mg via ORAL
  Filled 2018-12-30 (×3): qty 1

## 2018-12-30 MED ORDER — SODIUM CHLORIDE 0.9 % IV SOLN
1.0000 g | Freq: Once | INTRAVENOUS | Status: AC
  Administered 2018-12-30: 1 g via INTRAVENOUS
  Filled 2018-12-30: qty 10

## 2018-12-30 MED ORDER — AMLODIPINE BESYLATE 5 MG PO TABS
5.0000 mg | ORAL_TABLET | Freq: Every day | ORAL | Status: DC
  Administered 2018-12-30 – 2019-01-03 (×5): 5 mg via ORAL
  Filled 2018-12-30 (×5): qty 1

## 2018-12-30 MED ORDER — SODIUM CHLORIDE 0.9 % IV SOLN
200.0000 mg | Freq: Once | INTRAVENOUS | Status: AC
  Administered 2018-12-30: 200 mg via INTRAVENOUS
  Filled 2018-12-30: qty 40

## 2018-12-30 MED ORDER — SODIUM CHLORIDE 0.9 % IV SOLN
100.0000 mg | INTRAVENOUS | Status: AC
  Administered 2018-12-31 – 2019-01-03 (×4): 100 mg via INTRAVENOUS
  Filled 2018-12-30 (×4): qty 20

## 2018-12-30 MED ORDER — OXYCODONE HCL 5 MG PO TABS
5.0000 mg | ORAL_TABLET | Freq: Once | ORAL | Status: AC
  Administered 2018-12-30: 5 mg via ORAL
  Filled 2018-12-30: qty 1

## 2018-12-30 MED ORDER — ADULT MULTIVITAMIN W/MINERALS CH
1.0000 | ORAL_TABLET | Freq: Every day | ORAL | Status: DC
  Administered 2018-12-30 – 2019-01-03 (×5): 1 via ORAL
  Filled 2018-12-30 (×5): qty 1

## 2018-12-30 MED ORDER — FAMOTIDINE 20 MG PO TABS
20.0000 mg | ORAL_TABLET | Freq: Two times a day (BID) | ORAL | Status: DC
  Administered 2018-12-30 – 2019-01-03 (×9): 20 mg via ORAL
  Filled 2018-12-30 (×9): qty 1

## 2018-12-30 MED ORDER — ONDANSETRON HCL 4 MG/2ML IJ SOLN: 4.0000 mg | Freq: Four times a day (QID) | INTRAMUSCULAR | Status: DC | PRN

## 2018-12-30 MED ORDER — VITAMIN C 500 MG PO TABS
500.0000 mg | ORAL_TABLET | Freq: Every day | ORAL | Status: DC
  Administered 2018-12-30 – 2019-01-03 (×5): 500 mg via ORAL
  Filled 2018-12-30 (×5): qty 1

## 2018-12-30 MED ORDER — ACETAMINOPHEN 325 MG PO TABS
650.0000 mg | ORAL_TABLET | Freq: Once | ORAL | Status: AC
  Administered 2018-12-30: 650 mg via ORAL
  Filled 2018-12-30: qty 2

## 2018-12-30 MED ORDER — METHYLPREDNISOLONE SODIUM SUCC 125 MG IJ SOLR
60.0000 mg | Freq: Two times a day (BID) | INTRAMUSCULAR | Status: DC
  Administered 2018-12-30 – 2019-01-03 (×9): 60 mg via INTRAVENOUS
  Filled 2018-12-30 (×9): qty 2

## 2018-12-30 MED ORDER — ONDANSETRON HCL 4 MG PO TABS: 4.0000 mg | ORAL_TABLET | Freq: Four times a day (QID) | ORAL | Status: DC | PRN

## 2018-12-30 MED ORDER — ZINC SULFATE 220 (50 ZN) MG PO CAPS
220.0000 mg | ORAL_CAPSULE | Freq: Every day | ORAL | Status: DC
  Administered 2018-12-30 – 2019-01-03 (×5): 220 mg via ORAL
  Filled 2018-12-30 (×5): qty 1

## 2018-12-30 MED ORDER — VITAMIN B-1 100 MG PO TABS
100.0000 mg | ORAL_TABLET | Freq: Every day | ORAL | Status: DC
  Administered 2018-12-30 – 2019-01-03 (×5): 100 mg via ORAL
  Filled 2018-12-30 (×5): qty 1

## 2018-12-30 NOTE — ED Notes (Signed)
Pt placed on 2L [place]

## 2018-12-30 NOTE — ED Notes (Signed)
This RN called and spoke with patient's husband Beverely Low, per patient's request. Patient's husband updated on patient status and POC. Patient requested patient's husband go get her a Games developer for her phone. This request was relayed to [place]. Will continue to keep patient's husband informed on patient's admission status.

## 2018-12-30 NOTE — ED Notes (Signed)
POC DOWNTIME, bHCG results <5.0 IU/L

## 2018-12-30 NOTE — Progress Notes (Signed)
Pharmacy Brief Note  O:  ALT: 76 CXR: consistent with multifocal PNA SpO2: 92% on 2L [place]  A/P:  Patient is a candidate for remdesivir therapy. Will initiate remdesevir therapy with 200 mg iv once follwoed by 100 mg iv daily for 4 days.   Ulice Dash, PharmD, BCPS Clinical Pharmacist

## 2018-12-30 NOTE — ED Notes (Signed)
ED TO INPATIENT HANDOFF REPORT  Name/Age/Gender Molly Summers 43 y.o. female  Code Status   Home/SNF/Other Home  Chief Complaint positive covid  Level of Care/Admitting Diagnosis ED Disposition    ED Disposition Condition [facility] Area: [place] [[id number]]  Level of Care: Med-Surg [16]  Covid Evaluation: Confirmed COVID Positive  Isolation Risk Level: Low Risk/Droplet (Less than 4L [place] supplementation)  Diagnosis: COVID-19 virus infection [[id number]]  Admitting Physician: Darliss Cheney [[id number]]  Attending Physician: Darliss Cheney [phone]  Estimated length of stay: 3 - 4 days  Certification:: I certify this patient will need inpatient services for at least 2 midnights  PT Class (Do Not Modify): Inpatient [101]  PT Acc Code (Do Not Modify): Private [1]       Medical History Past Medical History:  Diagnosis Date  . High blood cholesterol level   . Hypertension     Allergies No Known Allergies  IV Location/Drains/Wounds Patient Lines/Drains/Airways Status   Active Line/Drains/Airways    Name:   Placement date:   Placement time:   Site:   Days:   Peripheral IV 12/30/18 Right Antecubital   12/30/18    0941    Antecubital   less than 1   Peripheral IV 12/30/18 Right Hand   12/30/18    0959    Hand   less than 1          Labs/Imaging Results for orders placed or performed during the hospital encounter of 12/30/18 (from the past 48 hour(s))  CBC with Differential     Status: None   Collection Time: 12/30/18  9:00 AM  Result Value Ref Range   WBC 7.0 4.0 - 10.5 K/uL   RBC 4.80 3.87 - 5.11 MIL/uL   Hemoglobin 14.2 12.0 - 15.0 g/dL   HCT 43.0 36.0 - 46.0 %   MCV 89.6 80.0 - 100.0 fL   MCH 29.6 26.0 - 34.0 pg   MCHC 33.0 30.0 - 36.0 g/dL   RDW 12.5 11.5 - 15.5 %   Platelets 290 150 - 400 K/uL   nRBC 0.0 0.0 - 0.2 %   Neutrophils Relative % 79 %   Neutro Abs 5.5 1.7 - 7.7 K/uL   Lymphocytes Relative 13 %   Lymphs  Abs 0.9 0.7 - 4.0 K/uL   Monocytes Relative 7 %   Monocytes Absolute 0.5 0.1 - 1.0 K/uL   Eosinophils Relative 0 %   Eosinophils Absolute 0.0 0.0 - 0.5 K/uL   Basophils Relative 0 %   Basophils Absolute 0.0 0.0 - 0.1 K/uL   Immature Granulocytes 1 %   Abs Immature Granulocytes 0.04 0.00 - 0.07 K/uL    Comment: Performed at [facility], [place] [address] [place], [place] [address]  Comprehensive metabolic panel     Status: Abnormal   Collection Time: 12/30/18  9:00 AM  Result Value Ref Range   Sodium 137 135 - 145 mmol/L   Potassium 3.6 3.5 - 5.1 mmol/L   Chloride 104 98 - 111 mmol/L   CO2 24 22 - 32 mmol/L   Glucose, Bld 111 (H) 70 - 99 mg/dL   BUN 6 6 - 20 mg/dL   Creatinine, Ser 0.85 0.44 - 1.00 mg/dL   Calcium 8.3 (L) 8.9 - 10.3 mg/dL   Total Protein 7.1 6.5 - 8.1 g/dL   Albumin 3.1 (L) 3.5 - 5.0 g/dL   AST 43 (H) 15 - 41 U/L   ALT  76 (H) 0 - 44 U/L   Alkaline Phosphatase 61 38 - 126 U/L   Total Bilirubin 0.4 0.3 - 1.2 mg/dL   GFR calc non Af Amer >60 >60 mL/min   GFR calc Af Amer >60 >60 mL/min   Anion gap 9 5 - 15    Comment: Performed at [facility], [address] [place], [place] [address]  Lactic acid, plasma     Status: None   Collection Time: 12/30/18  9:00 AM  Result Value Ref Range   Lactic Acid, Venous 1.1 0.5 - 1.9 mmol/L    Comment: Performed at [facility], [address] [place], [place] [address]   Dg Chest Portable 1 View  Result Date: 12/30/2018 CLINICAL DATA:  Shortness of breath. EXAM: PORTABLE CHEST 1 VIEW COMPARISON:  None. FINDINGS: The heart size and mediastinal contours are within normal limits. No pneumothorax or pleural effusion is noted. Bilateral perihilar and basilar airspace opacities are noted concerning for pneumonia. Right upper lobe airspace opacity is noted as well. The visualized skeletal structures are unremarkable. IMPRESSION: Bilateral lung opacities are noted, right greater  than left, most consistent with multifocal pneumonia. Electronically Signed   By: Lupita RaiderJames  Green Jr M.D.   On: 12/30/2018 10:37    Pending Labs Unresulted Labs (From admission, onward)    Start     Ordered   12/30/18 1119  Lactate dehydrogenase  Add-on,   AD     12/30/18 1118   12/30/18 1119  Procalcitonin  Add-on,   AD     12/30/18 1118   12/30/18 1119  Sedimentation rate  Add-on,   AD     12/30/18 1118   12/30/18 1119  Brain natriuretic peptide  Add-on,   AD     12/30/18 1118   12/30/18 1118  C-reactive protein  Add-on,   AD     12/30/18 1118   12/30/18 1118  D-dimer, quantitative (not at [facility])  Add-on,   AD     12/30/18 1118   12/30/18 1118  Ferritin  Add-on,   AD     12/30/18 1118   12/30/18 1118  HIV antibody (Routine Testing)  Add-on,   AD     12/30/18 1118   12/30/18 0900  Urinalysis, Routine w reflex microscopic  Once,   STAT     12/30/18 0859   12/30/18 0900  Blood culture (routine x 2)  BLOOD CULTURE X 2,   STAT     12/30/18 0859   12/30/18 0900  Lactic acid, plasma  Now then every 2 hours,   STAT     12/30/18 0859   Signed and Held  ABO/Rh  Once,   R     Signed and Held   Signed and Held  CBC  (enoxaparin (LOVENOX)    CrCl >/= 30 ml/min)  Once,   R    Comments: Baseline for enoxaparin therapy IF NOT ALREADY DRAWN.  Notify MD if PLT < 100 K.    Signed and Held   Signed and Held  Creatinine, serum  (enoxaparin (LOVENOX)    CrCl >/= 30 ml/min)  Once,   R    Comments: Baseline for enoxaparin therapy IF NOT ALREADY DRAWN.    Signed and Held   Signed and Held  Creatinine, serum  (enoxaparin (LOVENOX)    CrCl >/= 30 ml/min)  Weekly,   R    Comments: while on enoxaparin therapy    Signed and Held  Signed and Held  CBC with Differential/Platelet  Daily,   R     Signed and Held   Signed and Held  Comprehensive metabolic panel  Daily,   R     Signed and Held   Signed and Held  CK  Daily,   R     Signed and Held   Signed and Held  Magnesium  Daily,   R     Signed and  Held   Signed and Held  Strep pneumoniae urinary antigen  Once,   R     Signed and Held          Vitals/Pain Today's Vitals   12/30/18 0930 12/30/18 1000 12/30/18 1030 12/30/18 1100  BP: (!) 158/99 (!) 165/94 (!) 143/91 (!) 145/85  Pulse: (!) 117 (!) 120 (!) 106 (!) 103  Resp: (!) 42 20 (!) 33 (!) 31  Temp:      SpO2: 94% 92% 91% 91%  Weight:      Height:      PainSc:        Isolation Precautions No active isolations  Medications Medications  cefTRIAXone (ROCEPHIN) 1 g in sodium chloride 0.9 % 100 mL IVPB (1 g Intravenous New Bag/Given 12/30/18 1114)  azithromycin (ZITHROMAX) 500 mg in sodium chloride 0.9 % 250 mL IVPB (500 mg Intravenous New Bag/Given 12/30/18 1115)  sodium chloride 0.9 % bolus 1,000 mL (1,000 mLs Intravenous Bolus 12/30/18 0946)  acetaminophen (TYLENOL) tablet 650 mg (650 mg Oral Given 12/30/18 0930)    Mobility walks

## 2018-12-30 NOTE — ED Provider Notes (Signed)
[facility]-EMERGENCY DEPT Provider Note   CSN: [id number] Arrival date & time: 12/30/18  0820    History   Chief Complaint Chief Complaint  Patient presents with  . COVID+  . Cough  . Fever    HPI Molly Summers is a 43 y.o. female history of hypertension and hyperlipidemia noncompliant with his medications presents today with her 11th day of illness, fever, body aches, cough.  Diagnosed with COVID-19 8 days ago, self isolating and treating with Tylenol and Tessalon Perles since that time.  Feeling worse presents today for evaluation.  Last dose Tylenol last night, was to be seen by health at work this morning and they asked her not to take Tylenol prior to arrival.  Patient endorses minimally productive cough, body aches and headache.  Patient denies shortness of breath at rest but states she feels somewhat short of breath during her coughing fits.  No chest pain, abdominal pain, nausea/vomiting, diarrhea, dysuria/hematuria, extremity pain/swelling or other concerns.    HPI  Past Medical History:  Diagnosis Date  . High blood cholesterol level   . Hypertension     Patient Active Problem List   Diagnosis Date Noted  . COVID-19 virus infection 12/30/2018    Past Surgical History:  Procedure Laterality Date  . LAPAROSCOPIC OVARIAN Left      OB History   No obstetric history on file.      Home Medications    Prior to Admission medications   Medication Sig Start Date End Date Taking? Authorizing Provider  benzonatate (TESSALON) 100 MG capsule Take 1 capsule (100 mg total) by mouth 3 (three) times daily as needed for cough. 12/29/18  Yes Burchette, Elberta FortisBruce W, MD  famotidine (PEPCID) 20 MG tablet Take 20 mg by mouth 2 (two) times daily.   Yes [provider]  amLODipine (NORVASC) 5 MG tablet Take 1 tablet (5 mg total) by mouth daily. Patient not taking: Reported on 12/30/2018 08/11/18   Magdalene RiverWiseman, Brittany D, PA-C    Family History No  family history on file.  Social History Social History   Tobacco Use  . Smoking status: Never Smoker  . Smokeless tobacco: Never Used  Substance Use Topics  . Alcohol use: Not Currently    Frequency: Never  . Drug use: Never     Allergies   Patient has no known allergies.   Review of Systems Review of Systems  Constitutional: Positive for chills, diaphoresis, fatigue and fever.  Respiratory: Positive for cough and shortness of breath (Only with cough).   Cardiovascular: Negative.  Negative for chest pain and leg swelling.  Gastrointestinal: Negative.  Negative for abdominal pain, diarrhea, nausea and vomiting.  Musculoskeletal: Positive for arthralgias and myalgias. Negative for neck stiffness.  All other systems reviewed and are negative.  Physical Exam Updated Vital Signs BP (!) 145/85   Pulse (!) 103   Temp (!) 100.7 F (38.2 C)   Resp (!) 31   Ht 5\' 4"  (1.626 m)   Wt 101.6 kg   LMP 12/30/2018   SpO2 91%   BMI 38.45 kg/m   Physical Exam Constitutional:      General: She is not in acute distress.    Appearance: Normal appearance. She is well-developed. She is obese. She is ill-appearing. She is not toxic-appearing or diaphoretic.  HENT:     Head: Normocephalic and atraumatic.     Right Ear: External ear normal.     Left Ear: External ear normal.  Nose: Nose normal.  Eyes:     General: Vision grossly intact. Gaze aligned appropriately.     Pupils: Pupils are equal, round, and reactive to light.  Neck:     Musculoskeletal: Normal range of motion.     Trachea: Trachea and phonation normal. No tracheal deviation.  Cardiovascular:     Rate and Rhythm: Regular rhythm. Tachycardia present.     Pulses: Normal pulses.  Pulmonary:     Effort: Pulmonary effort is normal. Tachypnea present. No accessory muscle usage or respiratory distress.     Breath sounds: Normal air entry.  Chest:     Chest wall: No tenderness.  Abdominal:     General: There is no  distension.     Palpations: Abdomen is soft.     Tenderness: There is no abdominal tenderness. There is no guarding or rebound.  Musculoskeletal: Normal range of motion.     Right lower leg: Normal. She exhibits no tenderness. No edema.     Left lower leg: Normal. She exhibits no tenderness. No edema.  Feet:     Right foot:     Protective Sensation: 2 sites tested. 2 sites sensed.     Left foot:     Protective Sensation: 2 sites tested. 2 sites sensed.  Skin:    General: Skin is warm and dry.  Neurological:     Mental Status: She is alert.     GCS: GCS eye subscore is 4. GCS verbal subscore is 5. GCS motor subscore is 6.     Comments: Speech is clear and goal oriented, follows commands Major Cranial nerves without deficit, no facial droop Moves extremities without ataxia, coordination intact  Psychiatric:        Behavior: Behavior normal.      ED Treatments / Results  Labs (all labs ordered are listed, but only abnormal results are displayed) Labs Reviewed  COMPREHENSIVE METABOLIC PANEL - Abnormal; Notable for the following components:      Result Value   Glucose, Bld 111 (*)    Calcium 8.3 (*)    Albumin 3.1 (*)    AST 43 (*)    ALT 76 (*)    All other components within normal limits  CULTURE, BLOOD (ROUTINE X 2)  CULTURE, BLOOD (ROUTINE X 2)  CBC WITH DIFFERENTIAL/PLATELET  LACTIC ACID, PLASMA  URINALYSIS, ROUTINE W REFLEX MICROSCOPIC  LACTIC ACID, PLASMA  C-REACTIVE PROTEIN  D-DIMER, QUANTITATIVE (NOT AT [facility])  FERRITIN  HIV ANTIBODY (ROUTINE TESTING W REFLEX)  LACTATE DEHYDROGENASE  PROCALCITONIN  SEDIMENTATION RATE  BRAIN NATRIURETIC PEPTIDE  I-STAT BETA HCG BLOOD, ED ([facility], [facility], [facility] ONLY)    EKG EKG Interpretation  Date/Time:  Thursday December 30 2018 09:25:02 EDT Ventricular Rate:  116 PR Interval:    QRS Duration: 93 QT Interval:  315 QTC Calculation: 438 R Axis:   48 Text Interpretation:  Sinus tachycardia Low voltage, precordial leads Consider  anterior infarct Borderline T abnormalities, inferior leads Confirmed by Raeford RazorKohut, Stephen [phone]([id number]) on 12/30/2018 10:11:34 AM   Radiology Dg Chest Portable 1 View  Result Date: 12/30/2018 CLINICAL DATA:  Shortness of breath. EXAM: PORTABLE CHEST 1 VIEW COMPARISON:  None. FINDINGS: The heart size and mediastinal contours are within normal limits. No pneumothorax or pleural effusion is noted. Bilateral perihilar and basilar airspace opacities are noted concerning for pneumonia. Right upper lobe airspace opacity is noted as well. The visualized skeletal structures are unremarkable. IMPRESSION: Bilateral lung opacities are noted, right greater than left, most consistent with  multifocal pneumonia. Electronically Signed   By: Marijo Conception M.D.   On: 12/30/2018 10:37    Procedures .Critical Care Performed by: Deliah Boston, PA-C Authorized by: Deliah Boston, PA-C   Critical care provider statement:    Critical care time (minutes):  44   Critical care was necessary to treat or prevent imminent or life-threatening deterioration of the following conditions:  Respiratory failure and sepsis   Critical care was time spent personally by me on the following activities:  Discussions with consultants, evaluation of patient's response to treatment, examination of patient, ordering and performing treatments and interventions, ordering and review of laboratory studies, ordering and review of radiographic studies, pulse oximetry, re-evaluation of patient's condition, obtaining history from patient or surrogate, review of old charts and development of treatment plan with patient or surrogate   (including critical care time)  Medications Ordered in ED Medications  cefTRIAXone (ROCEPHIN) 1 g in sodium chloride 0.9 % 100 mL IVPB (1 g Intravenous New Bag/Given 12/30/18 1114)  azithromycin (ZITHROMAX) 500 mg in sodium chloride 0.9 % 250 mL IVPB (500 mg Intravenous New Bag/Given 12/30/18 1115)  sodium chloride  0.9 % bolus 1,000 mL (1,000 mLs Intravenous Bolus 12/30/18 0946)  acetaminophen (TYLENOL) tablet 650 mg (650 mg Oral Given 12/30/18 0930)     Initial Impression / Assessment and Plan / ED Course  I have reviewed the triage vital signs and the nursing notes.  Pertinent labs & imaging results that were available during my care of the patient were reviewed by me and considered in my medical decision making (see chart for details).  Clinical Course as of Dec 29 1128  Thu Dec 30, 2018  1054 Discussed with hospitalist.   [BM]    Clinical Course User Index [BM] Deliah Boston, PA-C   43 year old female with known COVID-19 presents today febrile, tachycardic and hypoxic at 89% on room air.  Placed on 2 L nasal cannula by nursing staff improved to 95% SPO2.  She is only with shortness of breath with coughing, no shortness of breath at rest, no chest pain, no signs of DVT. - Lactic 1.1 CBC within normal limits CMP unremarkable EKG:  Sinus tachycardia Low voltage, precordial leads Consider anterior infarct Borderline T abnormalities, inferior leads Confirmed by Virgel Manifold  CXR: IMPRESSION: Bilateral lung opacities are noted, right greater than left, most consistent with multifocal pneumonia. - IV fluids and Tylenol given.  Patient started on azithromycin and Rocephin for community-acquired pneumonia coverage.  Consult called to hospitalist service for admission. - Patient reevaluated she appears more comfortable with supplemental oxygen, no acute distress.  She states understanding of work-up and plan of care, she is agreeable for admission at this time has no further questions or concerns. - Discussed case with Dr. Doristine Bosworth from hospitalist service.  Patient has been admitted for further evaluation and management.  Patient's case and plan were discussed with Dr. Wilson Singer during this visit.  Note: Portions of this report may have been transcribed using voice recognition software. Every  effort was made to ensure accuracy; however, inadvertent computerized transcription errors may still be present.  Final Clinical Impressions(s) / ED Diagnoses   Final diagnoses:  BOFBP-10 virus detected  Multifocal pneumonia  Sepsis, due to unspecified organism, unspecified whether acute organ dysfunction present [facility])    ED Discharge Orders    None       Gari Crown 12/30/18 1133    Virgel Manifold, MD 01/02/19 1113

## 2018-12-30 NOTE — H&P (Signed)
History and Physical    Molly Summers WRU:[id number] DOB: Nov 06, 1975 DOA: 12/30/2018  PCP: Patient, No Pcp Per  Patient coming from: Home  I have personally briefly reviewed patient's old medical records in [place]  Chief Complaint: Body aches/fever  HPI: Molly Summers is a 43 y.o. female with medical history significant of hypertension, hyperlipidemia and GERD who has been having symptoms of cough, body ache, chills for past 11 days and has been having intermittent low-grade fever for about last few days which has become high-grade since yesterday with a T-max being 103.1 at home and was actually diagnosed with COVID 19+8 days ago comes to the emergency department to seek further care as her symptoms have been getting worse with cough and production of yellow sputum and recurrent fever unable to be controlled with antipyretics.  She has no other complaint.  ED Course: Upon arrival to the emergency department, she was tachycardic and hypoxic 89% on room air.  With 2 L of oxygen, she was 94%.  Rest of the lab work was fairly unremarkable.  Hospital service was consulted to admit the patient for further management.  Review of Systems: As per HPI otherwise 10 point review of systems negative.    Past Medical History:  Diagnosis Date  . High blood cholesterol level   . Hypertension     Past Surgical History:  Procedure Laterality Date  . LAPAROSCOPIC OVARIAN Left      reports that she has never smoked. She has never used smokeless tobacco. She reports previous alcohol use. She reports that she does not use drugs.  No Known Allergies  No family history on file.  Prior to Admission medications   Medication Sig Start Date End Date Taking? Authorizing Provider  benzonatate (TESSALON) 100 MG capsule Take 1 capsule (100 mg total) by mouth 3 (three) times daily as needed for cough. 12/29/18  Yes Burchette, Alinda Sierras, MD  famotidine (PEPCID) 20 MG tablet Take 20 mg by  mouth 2 (two) times daily.   Yes [provider]  amLODipine (NORVASC) 5 MG tablet Take 1 tablet (5 mg total) by mouth daily. Patient not taking: Reported on 12/30/2018 08/11/18   Leonie Douglas, PA-C    Physical Exam: Vitals:   12/30/18 0844 12/30/18 0930 12/30/18 1000 12/30/18 1030  BP: (!) 165/98 (!) 158/99 (!) 165/94 (!) 143/91  Pulse: (!) 120 (!) 117 (!) 120 (!) 106  Resp: 20 (!) 42 20 (!) 33  Temp: (!) 100.7 F (38.2 C)     SpO2: (!) 89% 94% 92% 91%  Weight:      Height:        Constitutional: NAD, calm, comfortable Vitals:   12/30/18 0844 12/30/18 0930 12/30/18 1000 12/30/18 1030  BP: (!) 165/98 (!) 158/99 (!) 165/94 (!) 143/91  Pulse: (!) 120 (!) 117 (!) 120 (!) 106  Resp: 20 (!) 42 20 (!) 33  Temp: (!) 100.7 F (38.2 C)     SpO2: (!) 89% 94% 92% 91%  Weight:      Height:       Eyes: PERRL, lids and conjunctivae normal, sick appearing ENMT: Mucous membranes are moist. Posterior pharynx clear of any exudate or lesions.Normal dentition.  Neck: normal, supple, no masses, no thyromegaly Respiratory: clear to auscultation bilaterally, no wheezing, no crackles. Normal respiratory effort. No accessory muscle use.  Cardiovascular: Regular rate and rhythm, no murmurs / rubs / gallops. No extremity edema. 2+ pedal pulses. No carotid bruits.  Abdomen:  no tenderness, no masses palpated. No hepatosplenomegaly. Bowel sounds positive.  Musculoskeletal: no clubbing / cyanosis. No joint deformity upper and lower extremities. Good ROM, no contractures. Normal muscle tone.  Skin: no rashes, lesions, ulcers. No induration Neurologic: CN 2-12 grossly intact. Sensation intact, DTR normal. Strength 5/5 in all 4.  Psychiatric: Normal judgment and insight. Alert and oriented x 3. Normal mood.    Labs on Admission: I have personally reviewed following labs and imaging studies  CBC: Recent Labs  Lab 12/30/18 0900  WBC 7.0  NEUTROABS 5.5  HGB 14.2  HCT 43.0  MCV 89.6  PLT  709   Basic Metabolic Panel: Recent Labs  Lab 12/30/18 0900  NA 137  K 3.6  CL 104  CO2 24  GLUCOSE 111*  BUN 6  CREATININE 0.85  CALCIUM 8.3*   GFR: Estimated Creatinine Clearance: 100 mL/min (by C-G formula based on SCr of 0.85 mg/dL). Liver Function Tests: Recent Labs  Lab 12/30/18 0900  AST 43*  ALT 76*  ALKPHOS 61  BILITOT 0.4  PROT 7.1  ALBUMIN 3.1*   No results for input(s): LIPASE, AMYLASE in the last 168 hours. No results for input(s): AMMONIA in the last 168 hours. Coagulation Profile: No results for input(s): INR, PROTIME in the last 168 hours. Cardiac Enzymes: No results for input(s): CKTOTAL, CKMB, CKMBINDEX, TROPONINI in the last 168 hours. BNP (last 3 results) No results for input(s): PROBNP in the last 8760 hours. HbA1C: No results for input(s): HGBA1C in the last 72 hours. CBG: No results for input(s): GLUCAP in the last 168 hours. Lipid Profile: No results for input(s): CHOL, HDL, LDLCALC, TRIG, CHOLHDL, LDLDIRECT in the last 72 hours. Thyroid Function Tests: No results for input(s): TSH, T4TOTAL, FREET4, T3FREE, THYROIDAB in the last 72 hours. Anemia Panel: No results for input(s): VITAMINB12, FOLATE, FERRITIN, TIBC, IRON, RETICCTPCT in the last 72 hours. Urine analysis: No results found for: COLORURINE, APPEARANCEUR, LABSPEC, [place], GLUCOSEU, HGBUR, BILIRUBINUR, KETONESUR, PROTEINUR, UROBILINOGEN, NITRITE, LEUKOCYTESUR  Radiological Exams on Admission: Dg Chest Portable 1 View  Result Date: 12/30/2018 CLINICAL DATA:  Shortness of breath. EXAM: PORTABLE CHEST 1 VIEW COMPARISON:  None. FINDINGS: The heart size and mediastinal contours are within normal limits. No pneumothorax or pleural effusion is noted. Bilateral perihilar and basilar airspace opacities are noted concerning for pneumonia. Right upper lobe airspace opacity is noted as well. The visualized skeletal structures are unremarkable. IMPRESSION: Bilateral lung opacities are noted,  right greater than left, most consistent with multifocal pneumonia. Electronically Signed   By: Marijo Conception M.D.   On: 12/30/2018 10:37    EKG: Independently reviewed.  Sinus tachycardia at 116 bpm with no ST-T wave changes.  Assessment/Plan Active Problems:   COVID-19 virus infection    COVID-19 pneumonia: Patient is hypoxic, tachycardic and has chest x-ray changes typical of COVID-19.  She will be admitted at [employer].  I will get basic inflammatory markers including CRP, ESR, d-dimer, ferritin, LDH and procalcitonin.  Consulted pharmacy to start her on Remdesvir once she gets to [facility].  Continue supportive care.  Tylenol for fever.  Hypertension: Blood controlled.  Resume amlodipine.  Monitor closely.   GERD: Resume famotidine.   DVT prophylaxis: Lovenox Code Status: Full code Family Communication: None available at bedside.  Patient alert, oriented and competent.  Discussed plan in length with patient. Disposition Plan: Likely back to home in next 2 to 3 days Consults called: None Admission status: Inpatient   Darliss Cheney MD [facility] Pager [phone]  If 7PM-7AM, please contact night-coverage [email or web address] Password [facility]  12/30/2018, 11:15 AM

## 2018-12-30 NOTE — ED Triage Notes (Signed)
Pt states she was dx with covid on 6/14. Pt was directed here by [employer]  since she was "not getting better".  Cough, fever (103.2 this AM)  Pt was told not to take any tylenol by [employer] .

## 2018-12-30 NOTE — ED Notes (Signed)
[facility] present to transport patient to [facility]

## 2018-12-30 NOTE — ED Notes (Signed)
[facility] called for patient transport to [facility]. Attempted to call report to [facility]. Was told that nurse was on her morning break and to call back.

## 2018-12-31 DIAGNOSIS — U071 COVID-19: Principal | ICD-10-CM

## 2018-12-31 DIAGNOSIS — J069 Acute upper respiratory infection, unspecified: Secondary | ICD-10-CM

## 2018-12-31 DIAGNOSIS — J189 Pneumonia, unspecified organism: Secondary | ICD-10-CM

## 2018-12-31 DIAGNOSIS — J9621 Acute and chronic respiratory failure with hypoxia: Secondary | ICD-10-CM

## 2018-12-31 DIAGNOSIS — I10 Essential (primary) hypertension: Secondary | ICD-10-CM

## 2018-12-31 LAB — COMPREHENSIVE METABOLIC PANEL
ALT: 59 U/L — ABNORMAL HIGH (ref 0–44)
AST: 31 U/L (ref 15–41)
Albumin: 2.9 g/dL — ABNORMAL LOW (ref 3.5–5.0)
Alkaline Phosphatase: 54 U/L (ref 38–126)
Anion gap: 11 (ref 5–15)
BUN: 9 mg/dL (ref 6–20)
CO2: 24 mmol/L (ref 22–32)
Calcium: 8.6 mg/dL — ABNORMAL LOW (ref 8.9–10.3)
Chloride: 108 mmol/L (ref 98–111)
Creatinine, Ser: 0.65 mg/dL (ref 0.44–1.00)
GFR calc Af Amer: >60 mL/min (ref >60)
GFR calc non Af Amer: >60 mL/min (ref >60)
Glucose, Bld: 118 mg/dL — ABNORMAL HIGH (ref 70–99)
Potassium: 3.9 mmol/L (ref 3.5–5.1)
Sodium: 143 mmol/L (ref 135–145)
Total Bilirubin: 0.3 mg/dL (ref 0.3–1.2)
Total Protein: 6.4 g/dL — ABNORMAL LOW (ref 6.5–8.1)

## 2018-12-31 LAB — CBC WITH DIFFERENTIAL/PLATELET
Abs Immature Granulocytes: 0.03 10*3/uL (ref 0.00–0.07)
Basophils Absolute: 0 10*3/uL (ref 0.0–0.1)
Basophils Relative: 0 %
Eosinophils Absolute: 0 10*3/uL (ref 0.0–0.5)
Eosinophils Relative: 0 %
HCT: 40.9 % (ref 36.0–46.0)
Hemoglobin: 13.6 g/dL (ref 12.0–15.0)
Immature Granulocytes: 1 %
Lymphocytes Relative: 18 %
Lymphs Abs: 0.7 10*3/uL (ref 0.7–4.0)
MCH: 30 pg (ref 26.0–34.0)
MCHC: 33.3 g/dL (ref 30.0–36.0)
MCV: 90.3 fL (ref 80.0–100.0)
Monocytes Absolute: 0.2 10*3/uL (ref 0.1–1.0)
Monocytes Relative: 5 %
Neutro Abs: 2.9 10*3/uL (ref 1.7–7.7)
Neutrophils Relative %: 76 %
Platelets: 297 10*3/uL (ref 150–400)
RBC: 4.53 MIL/uL (ref 3.87–5.11)
RDW: 12.4 % (ref 11.5–15.5)
WBC: 3.8 10*3/uL — ABNORMAL LOW (ref 4.0–10.5)
nRBC: 0.5 % — ABNORMAL HIGH (ref 0.0–0.2)

## 2018-12-31 LAB — CK: Total CK: 248 U/L — ABNORMAL HIGH (ref 38–234)

## 2018-12-31 LAB — D-DIMER, QUANTITATIVE: D-Dimer, Quant: 1.09 ug/mL-FEU — ABNORMAL HIGH (ref 0.00–0.50)

## 2018-12-31 LAB — MAGNESIUM: Magnesium: 1.8 mg/dL (ref 1.7–2.4)

## 2018-12-31 LAB — C-REACTIVE PROTEIN: CRP: 13.1 mg/dL — ABNORMAL HIGH (ref <1.0)

## 2018-12-31 LAB — HIV ANTIBODY (ROUTINE TESTING W REFLEX): HIV Screen 4th Generation wRfx: NONREACTIVE

## 2018-12-31 MED ORDER — ENSURE ENLIVE PO LIQD
237.0000 mL | Freq: Two times a day (BID) | ORAL | Status: DC
  Administered 2019-01-03: 237 mL via ORAL

## 2018-12-31 MED ORDER — HYDROCOD POLST-CPM POLST ER 10-8 MG/5ML PO SUER
5.0000 mL | Freq: Two times a day (BID) | ORAL | Status: DC | PRN
  Administered 2018-12-31: 5 mL via ORAL
  Filled 2018-12-31: qty 5

## 2018-12-31 NOTE — Progress Notes (Addendum)
PROGRESS NOTE  Molly Summers OHY:[id number] DOB: 10-Nov-1975 DOA: 12/30/2018  PCP: Patient, No Pcp Per  43 y.o.femalewith medical history significant forhypertension, hyperlipidemia and GERD who has been having symptoms of cough, body ache, chills for past 11 days and has been having intermittent low-grade fever for few days which became high-grade with a T-max being 103.1 at home and was actually diagnosed with COVID 19+8 days ago presented to the emergency department to seek further care as her symptoms have been getting worse with cough and production of yellow sputum and recurrent fever unable to be controlled with antipyretics.   She was noted to be hypoxic.  She was hospitalized for further management.  Reason for Visit: Acute respiratory disease due to COVID-19  Consultants: None  Procedures: None  Antibiotics: None  Subjective/Interval History: Patient states that she is feeling better this morning although she continues to have some shortness of breath and a persistent dry cough.  No nausea vomiting.   Assessment/Plan:  Acute Hypoxic Resp. Failure due to Acute Covid 19 Viral Illness  COVID-19 Labs  Recent Labs    12/30/18 0900 12/31/18 0300  DDIMER 1.53* 1.09*  FERRITIN 1,161*  --   LDH 570*  --   CRP 12.9* 13.1*    Fever: Temperature was 100.7 yesterday morning.  No fever since then. Oxygen requirements: Currently on 3 L of oxygen by nasal cannula saturating in the mid 90s Antibiotics: Received antibiotics in the ED.  Not continued Remdesivir: Day 2 Steroids: Solu-Medrol 60 mg every 12 hours Diuretics: Not given yet Actemra: Not given yet Convalescent Plasma: Not indicated yet Vitamin C and Zinc: Continue DVT Prophylaxis:  Lovenox 50 mg every 24 hours  Patient seems to be stable from a respiratory standpoint.  She is on 3 L of oxygen saturating in the mid 90s.  She continues to have a persistent cough.  Her d-dimer is 1.09 this morning.  CRP  13.1 today compared to 12.9 yesterday.  Since symptomatically patient has noted improvement we will continue just current treatment for now.  Continue to trend inflammatory markers.  No clear indication to add Actemra at this point in time.  However patient is agreeable to the same if needed.  Prone positioning incentive spirometry and mobilization recommended.  Antitussive agents.  The treatment plan and use of medications and known side effects were discussed with patient.  Some of the medications used are based on case reports/anecdotal data which are not peer-reviewed and has not been studied using randomized control trials.  Complete risks and long-term side effects are unknown, however in the best clinical judgment they seem to be of some benefit.  Patient agrees with the treatment plan and want to receive these treatments as indicated.  Patient was started on steroids.  Agreeable to Actemra if needed although currently not indicated.  History of GERD Continue famotidine  History of essential hypertension Continue amlodipine.  Monitor blood pressures closely.  Blood pressure noted to be elevated.  Hydralazine as needed.  Morbidly obese BMI 39.05  DVT Prophylaxis: Lovenox PUD Prophylaxis: Famotidine Code Status: Full Code Family Communication: Discussed with the patient.  She is states that she will notify her husband. Disposition Plan: Mobilize.  Management as outlined above.  Hopefully discharge home when better.   Medications:  Scheduled: . amLODipine  5 mg Oral Daily  . enoxaparin (LOVENOX) injection  0.5 mg/kg Subcutaneous Q24H  . famotidine  20 mg Oral BID  . mouth rinse  15 mL Mouth Rinse  BID  . methylPREDNISolone (SOLU-MEDROL) injection  60 mg Intravenous Q12H  . multivitamin with minerals  1 tablet Oral Daily  . thiamine  100 mg Oral Daily  . vitamin C  500 mg Oral Daily  . zinc sulfate  220 mg Oral Daily   Continuous: . remdesivir 100 mg in NS 250 mL      WJX:BJYNWGNFAOZHYPRN:acetaminophen, benzonatate, chlorpheniramine-HYDROcodone, ondansetron **OR** ondansetron (ZOFRAN) IV   Objective:  Vital Signs  Vitals:   12/30/18 1600 12/30/18 1933 12/30/18 2357 12/31/18 0500  BP: (!) 141/106 (!) 152/94 (!) 140/96 (!) 151/100  Pulse: 98 100 100 100  Resp: (!) 25 17 16 20   Temp: 99.2 F (37.3 C) 97.9 F (36.6 C) 98.4 F (36.9 C) 98.1 F (36.7 C)  TempSrc: Oral Oral Oral Oral  SpO2: 91% 91% 95% 94%  Weight:    103.2 kg  Height:        Intake/Output Summary (Last 24 hours) at 12/31/2018 1041 Last data filed at 12/31/2018 0630 Gross per 24 hour  Intake 491.31 ml  Output 1500 ml  Net -1008.69 ml   Filed Weights   12/30/18 0841 12/31/18 0500  Weight: 101.6 kg 103.2 kg    General appearance: Awake alert.  In no distress.  Morbidly obese Resp: Coarse breath sounds bilaterally with few crackles at the bases.  No wheezing or rhonchi.   Cardio: S1-S2 is normal regular.  No S3-S4.  No rubs murmurs or bruit GI: Abdomen is soft.  Nontender nondistended.  Bowel sounds are present normal.  No masses organomegaly Extremities: No edema.  Full range of motion of lower extremities. Neurologic: Alert and oriented x3.  No focal neurological deficits.    Lab Results:  Data Reviewed: I have personally reviewed following labs and imaging studies  CBC: Recent Labs  Lab 12/30/18 0900 12/31/18 0300  WBC 7.0 3.8*  NEUTROABS 5.5 2.9  HGB 14.2 13.6  HCT 43.0 40.9  MCV 89.6 90.3  PLT 290 297    Basic Metabolic Panel: Recent Labs  Lab 12/30/18 0900 12/31/18 0300  NA 137 143  K 3.6 3.9  CL 104 108  CO2 24 24  GLUCOSE 111* 118*  BUN 6 9  CREATININE 0.85 0.65  CALCIUM 8.3* 8.6*  MG  --  1.8    GFR: Estimated Creatinine Clearance: 107.2 mL/min (by C-G formula based on SCr of 0.65 mg/dL).  Liver Function Tests: Recent Labs  Lab 12/30/18 0900 12/31/18 0300  AST 43* 31  ALT 76* 59*  ALKPHOS 61 54  BILITOT 0.4 0.3  PROT 7.1 6.4*  ALBUMIN 3.1* 2.9*      Cardiac Enzymes: Recent Labs  Lab 12/31/18 0300  CKTOTAL 248*     Anemia Panel: Recent Labs    12/30/18 0900  FERRITIN 1,161*    Recent Results (from the past 240 hour(s))  Blood culture (routine x 2)     Status: None (Preliminary result)   Collection Time: 12/30/18  9:00 AM   Specimen: BLOOD  Result Value Ref Range Status   Specimen Description   Final    BLOOD RIGHT ANTECUBITAL Performed at [facility], [address] [place], [place] [address]    Special Requests   Final    BOTTLES DRAWN AEROBIC AND ANAEROBIC Blood Culture adequate volume Performed at [facility], [address] [place], [place] [address]    Culture   Final    NO GROWTH < 24 HOURS Performed at [facility], [address] [place], [place] [address]    Report Status PENDING  Incomplete  Blood culture (routine x 2)     Status: None (Preliminary result)   Collection Time: 12/30/18  9:49 AM   Specimen: BLOOD  Result Value Ref Range Status   Specimen Description   Final    BLOOD RIGHT HAND Performed at [facility], [address] [place], [place] [address]    Special Requests   Final    BOTTLES DRAWN AEROBIC AND ANAEROBIC Blood Culture adequate volume Performed at [facility], [address] [place], [place] [address]    Culture   Final    NO GROWTH < 24 HOURS Performed at [facility], [address] [place], [place] [address]    Report Status PENDING  Incomplete      Radiology Studies: Dg Chest Portable 1 View  Result Date: 12/30/2018 CLINICAL DATA:  Shortness of breath. EXAM: PORTABLE CHEST 1 VIEW COMPARISON:  None. FINDINGS: The heart size and mediastinal contours are within normal limits. No pneumothorax or pleural effusion is noted. Bilateral perihilar and basilar airspace opacities are noted concerning for pneumonia. Right upper lobe airspace opacity is noted as well. The visualized  skeletal structures are unremarkable. IMPRESSION: Bilateral lung opacities are noted, right greater than left, most consistent with multifocal pneumonia. Electronically Signed   By: Lupita RaiderJames  Green Jr M.D.   On: 12/30/2018 10:37       LOS: 1 day   Ginger Leeth  [facility] Pager on [email or web address]  12/31/2018, 10:41 AM

## 2018-12-31 NOTE — Progress Notes (Signed)
Initial Nutrition Assessment  DOCUMENTATION CODES:   Obesity unspecified  INTERVENTION:   Recommend to liberalize diet to Regular   Ensure Enlive po BID, each supplement provides 350 kcal and 20 grams of protein  Pt receiving Hormel Shake daily with Breakfast which provides 520 kcals and 22 g of protein and Magic cup BID with lunch and dinner, each supplement provides 290 kcal and 9 grams of protein, automatically on meal trays to optimize nutritional intake.    NUTRITION DIAGNOSIS:   Increased nutrient needs related to acute illness(COVID +) as evidenced by estimated needs.  GOAL:   Patient will meet greater than or equal to 90% of their needs  MONITOR:   PO intake, Supplement acceptance  REASON FOR ASSESSMENT:   Consult Assessment of nutrition requirement/status  ASSESSMENT:   Pt with PMH of HTN, HLD, and GERD who was dx with COVID and has been home with symptoms for the last 11 days who is now admitted with fever and acute respiratory disease from COVID-19.   Pt on 4 L O2 on admission.  Meal Completion: 5-50%  Medications reviewed and include: solumedrol, MVI, thaimine, 500 mg Vitamin C daily, 220 mg zinc sulfate daily Labs reviewed: CRP: 13.1 (H)    NUTRITION - FOCUSED PHYSICAL EXAM:  Deferred, RD working remotely   Diet Order:   Diet Order            Diet Heart Room service appropriate? Yes; Fluid consistency: Thin  Diet effective now              EDUCATION NEEDS:   No education needs have been identified at this time  Skin:  Skin Assessment: Reviewed RN Assessment  Last BM:  6/23  Height:   Ht Readings from Last 1 Encounters:  12/30/18 5\' 4"  (1.626 m)    Weight:   Wt Readings from Last 1 Encounters:  12/31/18 103.2 kg    Ideal Body Weight:  54.4 kg  BMI:  Body mass index is 39.05 kg/m.  Estimated Nutritional Needs:   Kcal:  2000-2200  Protein:  110-130 grams  Fluid:  > 2 L/day  Maylon Peppers RD, LDN, CNSC [phone]  Pager [phone] After Hours Pager

## 2018-12-31 NOTE — Progress Notes (Signed)
Patient stated she has already updated her family and we did not need to call her husband.

## 2019-01-01 DIAGNOSIS — I472 Ventricular tachycardia: Secondary | ICD-10-CM

## 2019-01-01 LAB — CBC WITH DIFFERENTIAL/PLATELET
Abs Immature Granulocytes: 0.17 10*3/uL — ABNORMAL HIGH (ref 0.00–0.07)
Basophils Absolute: 0 10*3/uL (ref 0.0–0.1)
Basophils Relative: 0 %
Eosinophils Absolute: 0 10*3/uL (ref 0.0–0.5)
Eosinophils Relative: 0 %
HCT: 43.3 % (ref 36.0–46.0)
Hemoglobin: 14.2 g/dL (ref 12.0–15.0)
Immature Granulocytes: 2 %
Lymphocytes Relative: 11 %
Lymphs Abs: 1.1 10*3/uL (ref 0.7–4.0)
MCH: 29.1 pg (ref 26.0–34.0)
MCHC: 32.8 g/dL (ref 30.0–36.0)
MCV: 88.7 fL (ref 80.0–100.0)
Monocytes Absolute: 0.6 10*3/uL (ref 0.1–1.0)
Monocytes Relative: 5 %
Neutro Abs: 8.5 10*3/uL — ABNORMAL HIGH (ref 1.7–7.7)
Neutrophils Relative %: 82 %
Platelets: 423 10*3/uL — ABNORMAL HIGH (ref 150–400)
RBC: 4.88 MIL/uL (ref 3.87–5.11)
RDW: 12.2 % (ref 11.5–15.5)
WBC: 10.4 10*3/uL (ref 4.0–10.5)
nRBC: 0.3 % — ABNORMAL HIGH (ref 0.0–0.2)

## 2019-01-01 LAB — COMPREHENSIVE METABOLIC PANEL
ALT: 56 U/L — ABNORMAL HIGH (ref 0–44)
AST: 27 U/L (ref 15–41)
Albumin: 3.2 g/dL — ABNORMAL LOW (ref 3.5–5.0)
Alkaline Phosphatase: 57 U/L (ref 38–126)
Anion gap: 9 (ref 5–15)
BUN: 17 mg/dL (ref 6–20)
CO2: 26 mmol/L (ref 22–32)
Calcium: 9.2 mg/dL (ref 8.9–10.3)
Chloride: 107 mmol/L (ref 98–111)
Creatinine, Ser: 0.78 mg/dL (ref 0.44–1.00)
Glucose, Bld: 148 mg/dL — ABNORMAL HIGH (ref 70–99)
Potassium: 3.8 mmol/L (ref 3.5–5.1)
Sodium: 142 mmol/L (ref 135–145)
Total Bilirubin: 0.4 mg/dL (ref 0.3–1.2)
Total Protein: 7 g/dL (ref 6.5–8.1)

## 2019-01-01 LAB — C-REACTIVE PROTEIN: CRP: 5.8 mg/dL — ABNORMAL HIGH (ref <1.0)

## 2019-01-01 LAB — D-DIMER, QUANTITATIVE: D-Dimer, Quant: 0.61 ug/mL-FEU — ABNORMAL HIGH (ref 0.00–0.50)

## 2019-01-01 LAB — FERRITIN: Ferritin: 1207 ng/mL — ABNORMAL HIGH (ref 11–307)

## 2019-01-01 LAB — CK: Total CK: 115 U/L (ref 38–234)

## 2019-01-01 LAB — MAGNESIUM: Magnesium: 2.3 mg/dL (ref 1.7–2.4)

## 2019-01-01 MED ORDER — HYDRALAZINE HCL 20 MG/ML IJ SOLN: 10.0000 mg | Freq: Four times a day (QID) | INTRAMUSCULAR | Status: DC | PRN

## 2019-01-01 MED ORDER — POTASSIUM CHLORIDE CRYS ER 20 MEQ PO TBCR
40.0000 meq | EXTENDED_RELEASE_TABLET | Freq: Once | ORAL | Status: AC
  Administered 2019-01-01: 40 meq via ORAL
  Filled 2019-01-01: qty 2

## 2019-01-01 NOTE — Progress Notes (Signed)
Pt stated she has updated her husband and no need to call for additional updates.

## 2019-01-01 NOTE — Progress Notes (Signed)
Pt had a 9 beat run of Vtach.  Pt is asymptomatic and vital signs are stable.  Paged on call doctor. MD aware.

## 2019-01-01 NOTE — Progress Notes (Signed)
PROGRESS NOTE  Molly Summers OVF:[id number] DOB: April 07, 1976 DOA: 12/30/2018  PCP: Patient, No Pcp Per  43 y.o.femalewith medical history significant forhypertension, hyperlipidemia and GERD who has been having symptoms of cough, body ache, chills for past 11 days and has been having intermittent low-grade fever for few days which became high-grade with a T-max being 103.1 at home and was actually diagnosed with COVID 19+8 days ago presented to the emergency department to seek further care as her symptoms have been getting worse with cough and production of yellow sputum and recurrent fever unable to be controlled with antipyretics.   She was noted to be hypoxic.  She was hospitalized for further management.  Reason for Visit: Acute respiratory disease due to COVID-19  Consultants: None  Procedures: None  Antibiotics: None  Subjective/Interval History: Patient did not have any symptoms when she had that brief run of nonsustained VT overnight.  Denies any chest pain this morning.  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  No abnormality was found.  Cough persists which is dry.  Shortness of breath has improved.  No other symptoms today.    Assessment/Plan:  Acute Hypoxic Resp. Failure due to Acute Covid 19 Viral Illness  COVID-19 Labs  Recent Labs    12/30/18 0900 12/31/18 0300 01/01/19 0455  DDIMER 1.53* 1.09* 0.61*  FERRITIN 1,161*  --  1,207*  LDH 570*  --   --   CRP 12.9* 13.1* 5.8*    Fever: No fever in the last 24 hours. Oxygen requirements: Currently on 2 L of oxygen by nasal cannula saturating in the early 90s.   Antibiotics: Received antibiotics in the ED.  Not continued Remdesivir: Day 3 Steroids: Solu-Medrol 60 mg every 12 hours Diuretics: Not given yet Actemra: Not given yet Convalescent Plasma: Not indicated yet Vitamin C and Zinc: Continue DVT  Prophylaxis:  Lovenox 50 mg every 24 hours  Patient seems to be stable to slightly improved from a respiratory standpoint.  She still requiring 2 L of oxygen by nasal cannula.  Continues to have a dry cough.  D-dimer 0.61.  Ferritin 1207.  CRP has improved to 5.8.  Continue steroids and Remdesivir.  Since she is improving we will not give her Actemra.  Continue prone positioning, incentive spirometry and mobilization.  Antitussive agents.  The treatment plan and use of medications and known side effects were discussed with patient.  Some of the medications used are based on case reports/anecdotal data which are not peer-reviewed and has not been studied using randomized control trials.  Complete risks and long-term side effects are unknown, however in the best clinical judgment they seem to be of some benefit.  Patient agrees with the treatment plan and want to receive these treatments as indicated.  Patient was started on steroids.  Agreeable to Actemra if needed although currently not indicated.  NSVT Patient noted to have a 9 beat episode of NSVT overnight.  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  No concern was noted on the echocardiogram at that time.  She has been asymptomatic.  Her magnesium level is normal this morning.  EKG does not show any concerning changes.  We will replete her potassium.  We will recheck TSH level tomorrow.  If patient has multiple episodes of  PVCs or another episode of NSVT we will put her on a low-dose beta-blocker.  Continue to monitor on telemetry.  History of GERD Continue famotidine  History of essential hypertension Continue amlodipine.  Monitor blood pressures closely.  Rise in blood pressure most likely due to steroids.  Morbidly obese BMI 39.05  DVT Prophylaxis: Lovenox PUD Prophylaxis: Famotidine Code Status: Full Code Family Communication:  Discussed with the patient.  She is states that she will notify her husband. Disposition Plan: Mobilize.  Management as outlined above.  Hopefully discharge home when better.   Medications:  Scheduled: . amLODipine  5 mg Oral Daily  . enoxaparin (LOVENOX) injection  0.5 mg/kg Subcutaneous Q24H  . famotidine  20 mg Oral BID  . feeding supplement (ENSURE ENLIVE)  237 mL Oral BID BM  . mouth rinse  15 mL Mouth Rinse BID  . methylPREDNISolone (SOLU-MEDROL) injection  60 mg Intravenous Q12H  . multivitamin with minerals  1 tablet Oral Daily  . thiamine  100 mg Oral Daily  . vitamin C  500 mg Oral Daily  . zinc sulfate  220 mg Oral Daily   Continuous: . remdesivir 100 mg in NS 250 mL 100 mg (12/31/18 1436)   WUJ:[id number]:acetaminophen, benzonatate, chlorpheniramine-HYDROcodone, ondansetron **OR** ondansetron (ZOFRAN) IV   Objective:  Vital Signs  Vitals:   01/01/19 0138 01/01/19 0515 01/01/19 0617 01/01/19 0740  BP: 138/86   (!) 161/90  Pulse:    95  Resp:    (!) 21  Temp:  98.3 F (36.8 C)  98.1 F (36.7 C)  TempSrc:  Oral  Oral  SpO2:    91%  Weight:   102.1 kg   Height:        Intake/Output Summary (Last 24 hours) at 01/01/2019 1155 Last data filed at 12/31/2018 1300 Gross per 24 hour  Intake 600 ml  Output -  Net 600 ml   Filed Weights   12/30/18 0841 12/31/18 0500 01/01/19 0617  Weight: 101.6 kg 103.2 kg 102.1 kg   General appearance: Awake alert.  In no distress.  Morbidly obese Resp: Coarse breath sounds bilaterally with crackles at the bases.  No wheezing or rhonchi. Cardio: S1-S2 is normal regular.  No S3-S4.  No rubs murmurs or bruit GI: Abdomen is soft.  Nontender nondistended.  Bowel sounds are present normal.  No masses organomegaly Extremities: No edema.  Full range of motion of lower extremities. Neurologic: Alert and oriented x3.  No focal neurological deficits.     Lab Results:  Data Reviewed: I have personally reviewed following labs and imaging studies   CBC: Recent Labs  Lab 12/30/18 0900 12/31/18 0300 01/01/19 0455  WBC 7.0 3.8* 10.4  NEUTROABS 5.5 2.9 8.5*  HGB 14.2 13.6 14.2  HCT 43.0 40.9 43.3  MCV 89.6 90.3 88.7  PLT 290 297 423*    Basic Metabolic Panel: Recent Labs  Lab 12/30/18 0900 12/31/18 0300 01/01/19 0455  NA 137 143 142  K 3.6 3.9 3.8  CL 104 108 107  CO2 24 24 26   GLUCOSE 111* 118* 148*  BUN 6 9 17   CREATININE 0.85 0.65 0.78  CALCIUM 8.3* 8.6* 9.2  MG  --  1.8 2.3    GFR: Estimated Creatinine Clearance: 106.6 mL/min (by C-G formula based on SCr of 0.78 mg/dL).  Liver Function Tests: Recent Labs  Lab 12/30/18 0900 12/31/18 0300 01/01/19 0455  AST 43* 31 27  ALT 76* 59* 56*  ALKPHOS 61 54 57  BILITOT 0.4 0.3  0.4  PROT 7.1 6.4* 7.0  ALBUMIN 3.1* 2.9* 3.2*     Cardiac Enzymes: Recent Labs  Lab 12/31/18 0300 01/01/19 0455  CKTOTAL 248* 115     Anemia Panel: Recent Labs    12/30/18 0900 01/01/19 0455  FERRITIN 1,161* 1,207*    Recent Results (from the past 240 hour(s))  Blood culture (routine x 2)     Status: None (Preliminary result)   Collection Time: 12/30/18  9:00 AM   Specimen: BLOOD  Result Value Ref Range Status   Specimen Description   Final    BLOOD RIGHT ANTECUBITAL Performed at [facility], [address] [place], [place] [address]    Special Requests   Final    BOTTLES DRAWN AEROBIC AND ANAEROBIC Blood Culture adequate volume Performed at [facility], [address] [place], [place] [address]    Culture   Final    NO GROWTH 2 DAYS Performed at [facility], [address] [place], [place] [address]    Report Status PENDING  Incomplete  Blood culture (routine x 2)     Status: None (Preliminary result)   Collection Time: 12/30/18  9:49 AM   Specimen: BLOOD  Result Value Ref Range Status   Specimen Description   Final    BLOOD RIGHT HAND Performed at [facility], [address]  [place], [place] [address]    Special Requests   Final    BOTTLES DRAWN AEROBIC AND ANAEROBIC Blood Culture adequate volume Performed at [facility], [address] [place], [place] [address]    Culture   Final    NO GROWTH 2 DAYS Performed at [facility], [address] [place], [place] [address]    Report Status PENDING  Incomplete      Radiology Studies: No results found.     LOS: 2 days   Katharin Schneider Rito EhrlichKrishnan  [facility] Pager on [email or web address]  01/01/2019, 11:55 AM

## 2019-01-01 NOTE — Progress Notes (Addendum)
0759  Resting in bed watching tv.  O2 sat 94% on room air at rest.  Dry cough noted.  Supplies provided for am care as requested by patient.    1000  Patient bathed and ambulated in room.  O2 sats 88-90% on room air with activity.  1500  Patient updated family on phone.  States we dont need to call them.

## 2019-01-01 NOTE — Plan of Care (Signed)
  Problem: Education: Goal: Knowledge of risk factors and measures for prevention of condition will improve Outcome: Progressing   Problem: Coping: Goal: Psychosocial and spiritual needs will be supported Outcome: Progressing   Problem: Respiratory: Goal: Will maintain a patent airway Outcome: Progressing Goal: Complications related to the disease process, condition or treatment will be avoided or minimized Outcome: Progressing   

## 2019-01-02 DIAGNOSIS — J9601 Acute respiratory failure with hypoxia: Secondary | ICD-10-CM

## 2019-01-02 LAB — CBC WITH DIFFERENTIAL/PLATELET
Abs Immature Granulocytes: 0.39 10*3/uL — ABNORMAL HIGH (ref 0.00–0.07)
Basophils Absolute: 0.1 10*3/uL (ref 0.0–0.1)
Basophils Relative: 0 %
Eosinophils Absolute: 0 10*3/uL (ref 0.0–0.5)
Eosinophils Relative: 0 %
HCT: 44.3 % (ref 36.0–46.0)
Hemoglobin: 14.4 g/dL (ref 12.0–15.0)
Immature Granulocytes: 3 %
Lymphocytes Relative: 8 %
Lymphs Abs: 1 10*3/uL (ref 0.7–4.0)
MCH: 29 pg (ref 26.0–34.0)
MCHC: 32.5 g/dL (ref 30.0–36.0)
MCV: 89.1 fL (ref 80.0–100.0)
Monocytes Absolute: 0.9 10*3/uL (ref 0.1–1.0)
Monocytes Relative: 7 %
Neutro Abs: 11 10*3/uL — ABNORMAL HIGH (ref 1.7–7.7)
Neutrophils Relative %: 82 %
Platelets: 481 10*3/uL — ABNORMAL HIGH (ref 150–400)
RBC: 4.97 MIL/uL (ref 3.87–5.11)
RDW: 12.3 % (ref 11.5–15.5)
WBC: 13.3 10*3/uL — ABNORMAL HIGH (ref 4.0–10.5)
nRBC: 0.2 % (ref 0.0–0.2)

## 2019-01-02 LAB — COMPREHENSIVE METABOLIC PANEL
ALT: 54 U/L — ABNORMAL HIGH (ref 0–44)
AST: 29 U/L (ref 15–41)
Albumin: 3.3 g/dL — ABNORMAL LOW (ref 3.5–5.0)
Alkaline Phosphatase: 58 U/L (ref 38–126)
Anion gap: 11 (ref 5–15)
BUN: 21 mg/dL — ABNORMAL HIGH (ref 6–20)
CO2: 23 mmol/L (ref 22–32)
Calcium: 9 mg/dL (ref 8.9–10.3)
Chloride: 107 mmol/L (ref 98–111)
Creatinine, Ser: 0.84 mg/dL (ref 0.44–1.00)
GFR calc Af Amer: >60 mL/min (ref >60)
GFR calc non Af Amer: >60 mL/min (ref >60)
Glucose, Bld: 143 mg/dL — ABNORMAL HIGH (ref 70–99)
Potassium: 4.1 mmol/L (ref 3.5–5.1)
Sodium: 141 mmol/L (ref 135–145)
Total Bilirubin: 0.3 mg/dL (ref 0.3–1.2)
Total Protein: 6.9 g/dL (ref 6.5–8.1)

## 2019-01-02 LAB — T4, FREE: Free T4: 1.25 ng/dL — ABNORMAL HIGH (ref 0.61–1.12)

## 2019-01-02 LAB — CK: Total CK: 98 U/L (ref 38–234)

## 2019-01-02 LAB — MAGNESIUM: Magnesium: 2.3 mg/dL (ref 1.7–2.4)

## 2019-01-02 LAB — TSH: TSH: 0.398 u[IU]/mL (ref 0.350–4.500)

## 2019-01-02 LAB — D-DIMER, QUANTITATIVE: D-Dimer, Quant: 0.51 ug/mL-FEU — ABNORMAL HIGH (ref 0.00–0.50)

## 2019-01-02 LAB — C-REACTIVE PROTEIN: CRP: 2.5 mg/dL — ABNORMAL HIGH (ref <1.0)

## 2019-01-02 NOTE — Progress Notes (Signed)
PROGRESS NOTE  Molly Summers ZOX:[id number] DOB: [birth year] DOA: 12/30/2018  PCP: Patient, No Pcp Per  43 y.o.femalewith medical history significant forhypertension, hyperlipidemia and GERD who has been having symptoms of cough, body ache, chills for past 11 days and has been having intermittent low-grade fever for few days which became high-grade with a T-max being 103.1 at home and was actually diagnosed with COVID 19+8 days ago presented to the emergency department to seek further care as her symptoms have been getting worse with cough and production of yellow sputum and recurrent fever unable to be controlled with antipyretics.   She was noted to be hypoxic.  She was hospitalized for further management.  Reason for Visit: Acute respiratory disease due to COVID-19  Consultants: None  Procedures: None  Antibiotics: None  Subjective/Interval History: Patient states that she slept well overnight.  She continues to have a dry cough.  Occasional shortness of breath.  No nausea vomiting.  Denies any chest pain dizziness lightheadedness.      Assessment/Plan:  Acute Hypoxic Resp. Failure due to Acute Covid 19 Viral Illness  COVID-19 Labs  Recent Labs    12/31/18 0300 01/01/19 0455 01/02/19 0444  DDIMER 1.09* 0.61* 0.51*  FERRITIN  --  1,207*  --   CRP 13.1* 5.8* 2.5*    Fever: Afebrile in the last 48 hours Oxygen requirements: Currently on 2 L of oxygen saturating in the 90s.  Wean down to 1 L today.    Antibiotics: Received antibiotics in the ED.  Not continued Remdesivir: Day 4 Steroids: Solu-Medrol 60 mg every 12 hours Diuretics: Not given yet Actemra: Not given yet Convalescent Plasma: Not indicated yet Vitamin C and Zinc: Continue DVT Prophylaxis:  Lovenox 50 mg every 24 hours  Patient seems to be stable from a respiratory standpoint.  She is still on 2 L of oxygen but can be weaned down to 1 L.  Continues to have dry cough.  CRP has improved to 2.5.   D-dimer 0.51.  Continue Remdesivir and steroids.  Incentive spirometry prone positioning and mobilization.  She has not received Actemra due to clinical improvement.  Antitussive agents.  The treatment plan and use of medications and known side effects were discussed with patient.  Some of the medications used are based on case reports/anecdotal data which are not peer-reviewed and has not been studied using randomized control trials.  Complete risks and long-term side effects are unknown, however in the best clinical judgment they seem to be of some benefit.  Patient agrees with the treatment plan and want to receive these treatments as indicated.  Patient was started on steroids.  Agreeable to Actemra if needed although currently not indicated.  NSVT Patient with occasional PVCs and 2 episodes of nonsustained ventricular tachycardia so far.  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  No concern was noted on the echocardiogram at that time.  She has been asymptomatic.  He was given potassium yesterday.  Her magnesium is normal.  Her potassium is normal today.  Her TSH is 0.39.  Free T4 is 1.25.  Slightly higher than the normal range.  Will need to be repeated in a few weeks time.  Continue to monitor for now.  Heart rate otherwise in the 60s.  Will hold off on beta-blocker for now.   History of GERD Continue famotidine  History of essential hypertension Continue amlodipine.  Monitor blood pressures closely.  Rise in blood pressure most likely due to steroids.  Morbidly obese BMI 39.05  DVT Prophylaxis: Lovenox PUD Prophylaxis: Famotidine Code Status: Full Code Family Communication: Being discussed with patient on a daily basis.  She says she will notify her of the family members. Disposition Plan: Mobilize.  Management as outlined above.  Hopefully discharge home when better.  Continue to  mobilize.   Medications:  Scheduled: . amLODipine  5 mg Oral Daily  . enoxaparin (LOVENOX) injection  0.5 mg/kg Subcutaneous Q24H  . famotidine  20 mg Oral BID  . feeding supplement (ENSURE ENLIVE)  237 mL Oral BID BM  . mouth rinse  15 mL Mouth Rinse BID  . methylPREDNISolone (SOLU-MEDROL) injection  60 mg Intravenous Q12H  . multivitamin with minerals  1 tablet Oral Daily  . thiamine  100 mg Oral Daily  . vitamin C  500 mg Oral Daily  . zinc sulfate  220 mg Oral Daily   Continuous: . remdesivir 100 mg in NS 250 mL 100 mg (01/01/19 1441)   ZOX:[id number]:acetaminophen, benzonatate, chlorpheniramine-HYDROcodone, hydrALAZINE, ondansetron **OR** ondansetron (ZOFRAN) IV   Objective:  Vital Signs  Vitals:   01/01/19 1955 01/02/19 0520 01/02/19 0732 01/02/19 0733  BP: (!) 146/98   127/81  Pulse:    64  Resp:    (!) 21  Temp: 98.3 F (36.8 C) 98 F (36.7 C) 98 F (36.7 C)   TempSrc: Oral Oral Oral   SpO2: 92%   95%  Weight:      Height:        Intake/Output Summary (Last 24 hours) at 01/02/2019 1035 Last data filed at 01/01/2019 1300 Gross per 24 hour  Intake 360 ml  Output -  Net 360 ml   Filed Weights   12/30/18 0841 12/31/18 0500 01/01/19 0617  Weight: 101.6 kg 103.2 kg 102.1 kg   General appearance: Awake alert.  In no distress.  Morbidly obese Resp: Coarse breath sounds bilaterally.  Few crackles at the bases.  No wheezing or rhonchi. Cardio: S1-S2 is normal regular.  No S3-S4.  No rubs murmurs or bruit.  Telemetry showed brief episode of NSVT this morning. GI: Abdomen is soft.  Nontender nondistended.  Bowel sounds are present normal.  No masses organomegaly Extremities: No edema.  Full range of motion of lower extremities. Neurologic: Alert and oriented x3.  No focal neurological deficits.    Lab Results:  Data Reviewed: I have personally reviewed following labs and imaging studies  CBC: Recent Labs  Lab 12/30/18 0900 12/31/18 0300 01/01/19 0455 01/02/19 0444   WBC 7.0 3.8* 10.4 13.3*  NEUTROABS 5.5 2.9 8.5* 11.0*  HGB 14.2 13.6 14.2 14.4  HCT 43.0 40.9 43.3 44.3  MCV 89.6 90.3 88.7 89.1  PLT 290 297 423* 481*    Basic Metabolic Panel: Recent Labs  Lab 12/30/18 0900 12/31/18 0300 01/01/19 0455 01/02/19 0444  NA 137 143 142 141  K 3.6 3.9 3.8 4.1  CL 104 108 107 107  CO2 24 24 26 23   GLUCOSE 111* 118* 148* 143*  BUN 6 9 17  21*  CREATININE 0.85 0.65 0.78 0.84  CALCIUM 8.3* 8.6* 9.2 9.0  MG  --  1.8 2.3 2.3    GFR: Estimated Creatinine Clearance: 101.5 mL/min (by C-G formula based on SCr of 0.84 mg/dL).  Liver Function Tests: Recent Labs  Lab 12/30/18 0900 12/31/18 0300 01/01/19 0455 01/02/19 0444  AST 43* 31 27 29   ALT 76* 59* 56*  54*  ALKPHOS 61 54 57 58  BILITOT 0.4 0.3 0.4 0.3  PROT 7.1 6.4* 7.0 6.9  ALBUMIN 3.1* 2.9* 3.2* 3.3*     Cardiac Enzymes: Recent Labs  Lab 12/31/18 0300 01/01/19 0455 01/02/19 0444  CKTOTAL 248* 115 98     Anemia Panel: Recent Labs    01/01/19 0455  FERRITIN 1,207*    Recent Results (from the past 240 hour(s))  Blood culture (routine x 2)     Status: None (Preliminary result)   Collection Time: 12/30/18  9:00 AM   Specimen: BLOOD  Result Value Ref Range Status   Specimen Description   Final    BLOOD RIGHT ANTECUBITAL Performed at [facility], [place] [address], [place], [place] [address]    Special Requests   Final    BOTTLES DRAWN AEROBIC AND ANAEROBIC Blood Culture adequate volume Performed at [facility] [address] [place], [place] [address]    Culture   Final    NO GROWTH 3 DAYS Performed at [facility], [place] [address], [place], [place] [address]    Report Status PENDING  Incomplete  Blood culture (routine x 2)     Status: None (Preliminary result)   Collection Time: 12/30/18  9:49 AM   Specimen: BLOOD  Result Value Ref Range Status   Specimen Description   Final    BLOOD RIGHT HAND Performed at [facility] [address], [place], [place] [address]    Special Requests   Final    BOTTLES DRAWN AEROBIC AND ANAEROBIC Blood Culture adequate volume Performed at [facility] [address] [place], [place] [address]    Culture   Final    NO GROWTH 3 DAYS Performed at [facility], [place] [address], [place], [place] [address]    Report Status PENDING  Incomplete      Radiology Studies: No results found.     LOS: 3 days   Preston Weill [employer] on [email or web address]  01/02/2019, 10:35 AM

## 2019-01-02 NOTE — Plan of Care (Signed)
  Problem: Education: Goal: Knowledge of risk factors and measures for prevention of condition will improve Outcome: Progressing   Problem: Coping: Goal: Psychosocial and spiritual needs will be supported Outcome: Progressing   Problem: Respiratory: Goal: Will maintain a patent airway Outcome: Progressing Goal: Complications related to the disease process, condition or treatment will be avoided or minimized Outcome: Progressing   

## 2019-01-02 NOTE — Progress Notes (Addendum)
0736  Resting in bed.  O2 sat 96% on 2l/[place].  Dry cough noted.  1227  Ambulated approximately 190 ft in hallway on room air.  Starting O2 sat at rest 94%.  Sats decreased to 90% with ambulation.  Reports some shortness of breath.  Back to room safely and resting. O2 sat 94% on room air.

## 2019-01-03 LAB — CBC WITH DIFFERENTIAL/PLATELET
Abs Immature Granulocytes: 0.47 K/uL — ABNORMAL HIGH (ref 0.00–0.07)
Basophils Absolute: 0 K/uL (ref 0.0–0.1)
Basophils Relative: 0 %
Eosinophils Absolute: 0 K/uL (ref 0.0–0.5)
Eosinophils Relative: 0 %
HCT: 41.4 % (ref 36.0–46.0)
Hemoglobin: 13.3 g/dL (ref 12.0–15.0)
Immature Granulocytes: 5 %
Lymphocytes Relative: 8 %
Lymphs Abs: 0.9 K/uL (ref 0.7–4.0)
MCH: 28.9 pg (ref 26.0–34.0)
MCHC: 32.1 g/dL (ref 30.0–36.0)
MCV: 90 fL (ref 80.0–100.0)
Monocytes Absolute: 0.8 K/uL (ref 0.1–1.0)
Monocytes Relative: 8 %
Neutro Abs: 8.3 K/uL — ABNORMAL HIGH (ref 1.7–7.7)
Neutrophils Relative %: 79 %
Platelets: 459 K/uL — ABNORMAL HIGH (ref 150–400)
RBC: 4.6 MIL/uL (ref 3.87–5.11)
RDW: 12.4 % (ref 11.5–15.5)
WBC: 10.5 K/uL (ref 4.0–10.5)
nRBC: 0.3 % — ABNORMAL HIGH (ref 0.0–0.2)

## 2019-01-03 LAB — COMPREHENSIVE METABOLIC PANEL WITH GFR
ALT: 68 U/L — ABNORMAL HIGH (ref 0–44)
AST: 35 U/L (ref 15–41)
Albumin: 3 g/dL — ABNORMAL LOW (ref 3.5–5.0)
Alkaline Phosphatase: 48 U/L (ref 38–126)
Anion gap: 12 (ref 5–15)
BUN: 23 mg/dL — ABNORMAL HIGH (ref 6–20)
CO2: 22 mmol/L (ref 22–32)
Calcium: 8.5 mg/dL — ABNORMAL LOW (ref 8.9–10.3)
Chloride: 107 mmol/L (ref 98–111)
Creatinine, Ser: 0.85 mg/dL (ref 0.44–1.00)
GFR calc Af Amer: >60 mL/min
GFR calc non Af Amer: >60 mL/min
Glucose, Bld: 118 mg/dL — ABNORMAL HIGH (ref 70–99)
Potassium: 3.8 mmol/L (ref 3.5–5.1)
Sodium: 141 mmol/L (ref 135–145)
Total Bilirubin: 0.3 mg/dL (ref 0.3–1.2)
Total Protein: 6.2 g/dL — ABNORMAL LOW (ref 6.5–8.1)

## 2019-01-03 LAB — C-REACTIVE PROTEIN: CRP: 1 mg/dL — ABNORMAL HIGH (ref <1.0)

## 2019-01-03 MED ORDER — PREDNISONE 20 MG PO TABS: ORAL_TABLET | ORAL | 0 refills | Status: DC

## 2019-01-03 MED ORDER — BENZONATATE 100 MG PO CAPS: 100.0000 mg | ORAL_CAPSULE | Freq: Three times a day (TID) | ORAL | 0 refills | Status: DC | PRN

## 2019-01-03 MED ORDER — ZINC SULFATE 220 (50 ZN) MG PO CAPS: 220.0000 mg | ORAL_CAPSULE | Freq: Every day | ORAL | 0 refills | Status: AC

## 2019-01-03 MED ORDER — ASCORBIC ACID 500 MG PO TABS: 500.0000 mg | ORAL_TABLET | Freq: Every day | ORAL | 0 refills | Status: AC

## 2019-01-03 NOTE — Discharge Summary (Signed)
[facility]  Physician Discharge Summary   Patient ID: Molly Summers MRN: [id number] DOB/AGE: 08-28-1975 43 y.o.  Admit date: 12/30/2018 Discharge date: 01/03/2019  PCP: Patient, No Pcp Per  DISCHARGE DIAGNOSES:  Acute respiratory disease due to COVID-19 Acute hypoxic respiratory failure, resolved PVCs and nonsustained ventricular tachycardia GERD Essential hypertension Morbidly obese  RECOMMENDATIONS FOR OUTPATIENT FOLLOW UP: 1. Patient is to follow-up with her PCP for episodes of NSVT 2. Repeat TSH and free T4 in 2 to 3 weeks.    Home Health: None Equipment/Devices: None  CODE STATUS: Full code  DISCHARGE CONDITION: fair  Diet recommendation: As before  INITIAL HISTORY: 43 y.o.femalewith medical history significantforhypertension, hyperlipidemia and GERD who has been having symptoms of cough, body ache, chills for past 11 days and has been having intermittent low-grade fever for few days which became high-grade with a T-max being 103.1 at home and was actually diagnosed with COVID 19+8 days agopresentedto the emergency department to seek further care as her symptoms have been getting worse with cough and production of yellow sputum and recurrent fever unable to be controlled with antipyretics. She was noted to be hypoxic. She was hospitalized for further management.   HOSPITAL COURSE:    Acute Hypoxic Resp. Failure due to Acute Covid 19 Viral Illness Patient was hospitalized.  She was requiring 2 to 3 L of oxygen by nasal cannula.  She was placed on Remdesivir and steroids.  Incentive spirometry, prone positioning.  She was mobilized.  She slowly started improving.  Did not require Actemra or convalescent plasma.  She did have a dry cough for which antitussive agents were prescribed.  She slowly started improving.  She is now saturating normal on room air.  She has ambulated.  She is very keen on going home today.  Okay for discharge.  Inflammatory  markers improved.  CRP is down to 2.5.  D-dimer not significantly elevated.  NSVT Patient with occasional PVCs and 2 episodes of nonsustained ventricular tachycardia so far.  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  No concern was noted on the echocardiogram at that time.  She has been asymptomatic.  Her electrolytes were repleted.  TSH was 0.39.  Free T4 1.25.  Free T4 was slightly higher than the normal range.  Will need these repeated in a few weeks time.  Heart rate has been in the 60s.  We did consider beta-blocker. However because she has been evaluated in the past for the same and she appears to be asymptomatic we will hold off on it for now.  She can discuss this further with her PCP.   History of GERD Continue famotidine  History of essential hypertension Continue home medications.  Morbidly obese BMI 39.05  Overall stable.  Okay for discharge home today.    PERTINENT LABS:  The results of significant diagnostics from this hospitalization (including imaging, microbiology, ancillary and laboratory) are listed below for reference.    Microbiology: Recent Results (from the past 240 hour(s))  Blood culture (routine x 2)     Status: None (Preliminary result)   Collection Time: 12/30/18  9:00 AM   Specimen: BLOOD  Result Value Ref Range Status   Specimen Description   Final    BLOOD RIGHT ANTECUBITAL Performed at [facility], [address] [place], [place] [address]    Special Requests   Final  BOTTLES DRAWN AEROBIC AND ANAEROBIC Blood Culture adequate volume Performed at [facility], [address] [place], [place] [address]    Culture   Final    NO GROWTH 4 DAYS Performed at [facility], [address] [place], [place] [address]    Report Status PENDING  Incomplete  Blood culture (routine x 2)     Status:  None (Preliminary result)   Collection Time: 12/30/18  9:49 AM   Specimen: BLOOD  Result Value Ref Range Status   Specimen Description   Final    BLOOD RIGHT HAND Performed at [facility], [address] [place], [place] [address]    Special Requests   Final    BOTTLES DRAWN AEROBIC AND ANAEROBIC Blood Culture adequate volume Performed at [facility], [address] [place], [place] [address]    Culture   Final    NO GROWTH 4 DAYS Performed at [facility], [address] [place], [place] [address]    Report Status PENDING  Incomplete     Labs: Basic Metabolic Panel: Recent Labs  Lab 12/30/18 0900 12/31/18 0300 01/01/19 0455 01/02/19 0444 01/03/19 0222  NA 137 143 142 141 141  K 3.6 3.9 3.8 4.1 3.8  CL 104 108 107 107 107  CO2 24 24 26 23 22   GLUCOSE 111* 118* 148* 143* 118*  BUN 6 9 17  21* 23*  CREATININE 0.85 0.65 0.78 0.84 0.85  CALCIUM 8.3* 8.6* 9.2 9.0 8.5*  MG  --  1.8 2.3 2.3  --    Liver Function Tests: Recent Labs  Lab 12/30/18 0900 12/31/18 0300 01/01/19 0455 01/02/19 0444 01/03/19 0222  AST 43* 31 27 29  35  ALT 76* 59* 56* 54* 68*  ALKPHOS 61 54 57 58 48  BILITOT 0.4 0.3 0.4 0.3 0.3  PROT 7.1 6.4* 7.0 6.9 6.2*  ALBUMIN 3.1* 2.9* 3.2* 3.3* 3.0*   CBC: Recent Labs  Lab 12/30/18 0900 12/31/18 0300 01/01/19 0455 01/02/19 0444 01/03/19 0222  WBC 7.0 3.8* 10.4 13.3* 10.5  NEUTROABS 5.5 2.9 8.5* 11.0* 8.3*  HGB 14.2 13.6 14.2 14.4 13.3  HCT 43.0 40.9 43.3 44.3 41.4  MCV 89.6 90.3 88.7 89.1 90.0  PLT 290 297 423* 481* 459*   Cardiac Enzymes: Recent Labs  Lab 12/31/18 0300 01/01/19 0455 01/02/19 0444  CKTOTAL 248* 115 98   BNP: BNP (last 3 results) Recent Labs    12/30/18 0900  BNP 73.4     IMAGING STUDIES Dg Chest Portable 1 View  Result Date: 12/30/2018 CLINICAL DATA:  Shortness of breath. EXAM: PORTABLE CHEST 1 VIEW COMPARISON:  None. FINDINGS: The heart size and mediastinal  contours are within normal limits. No pneumothorax or pleural effusion is noted. Bilateral perihilar and basilar airspace opacities are noted concerning for pneumonia. Right upper lobe airspace opacity is noted as well. The visualized skeletal structures are unremarkable. IMPRESSION: Bilateral lung opacities are noted, right greater than left, most consistent with multifocal pneumonia. Electronically Signed   By: Lupita RaiderJames  Green Jr M.D.   On: 12/30/2018 10:37    DISCHARGE EXAMINATION: Vitals:   01/02/19 1600 01/02/19 2001 01/03/19 0347 01/03/19 0904  BP:  (!) 147/91 122/83 (!) 145/104  Pulse: 76 81 64 77  Resp: 17 (!) 26 (!) 23 (!) 22  Temp:  98.1 F (36.7 C) 97.7 F (36.5 C)   TempSrc:  Oral Oral   SpO2: 92% 91% 93% 92%  Weight:  102 kg   Height:       General appearance: Awake alert.  In no distress Resp: Normal effort at rest.  Improved aeration bilaterally.  Few crackles at the bases.  No wheezing or rhonchi. Cardio: S1-S2 is normal regular.  No S3-S4.  No rubs murmurs or bruit GI: Abdomen is soft.  Nontender nondistended.  Bowel sounds are present normal.  No masses organomegaly Extremities: No edema.  Full range of motion of lower extremities. Neurologic: Alert and oriented x3.  No focal neurological deficits.    DISPOSITION: Home  Discharge Instructions    Call MD for:  difficulty breathing, headache or visual disturbances   Complete by: As directed    Call MD for:  extreme fatigue   Complete by: As directed    Call MD for:  persistant dizziness or light-headedness   Complete by: As directed    Call MD for:  persistant nausea and vomiting   Complete by: As directed    Call MD for:  severe uncontrolled pain   Complete by: As directed    Call MD for:  temperature >100.4   Complete by: As directed    Diet - low sodium heart healthy   Complete by: As directed    Discharge instructions   Complete by: As directed    Be sure to schedule a tele-visit with your primary care  provider in 1 to 2 weeks.  Take your medications as prescribed.  Discussed with them regarding the episodes of PVCs and nonsustained VT.  You will need to have thyroid function tests rechecked in 3 to 4 weeks.  You were cared for by a hospitalist during your hospital stay. If you have any questions about your discharge medications or the care you received while you were in the hospital after you are discharged, you can call the unit and asked to speak with the hospitalist on call if the hospitalist that took care of you is not available. Once you are discharged, your primary care physician will handle any further medical issues. Please note that NO REFILLS for any discharge medications will be authorized once you are discharged, as it is imperative that you return to your primary care physician (or establish a relationship with a primary care physician if you do not have one) for your aftercare needs so that they can reassess your need for medications and monitor your lab values. If you do not have a primary care physician, you can call [phone] for a physician referral.   Increase activity slowly   Complete by: As directed         Allergies as of 01/03/2019   No Known Allergies     Medication List    TAKE these medications   amLODipine 5 MG tablet Commonly known as: NORVASC Take 1 tablet (5 mg total) by mouth daily.   ascorbic acid 500 MG tablet Commonly known as: VITAMIN C Take 1 tablet (500 mg total) by mouth daily for 10 days. Start taking on: January 04, 2019   benzonatate 100 MG capsule Commonly known as: TESSALON Take 1 capsule (100 mg total) by mouth 3 (three) times daily as needed for cough.   famotidine 20 MG tablet Commonly known as: PEPCID Take 20 mg by mouth 2 (two) times daily.   predniSONE 20 MG tablet Commonly known as: DELTASONE Take 3 tablets once daily for 4 days, then take 2 tablets once daily for 4 days, then take 1 tablet once daily for 4 days,  then STOP.    zinc sulfate 220 (50 Zn) MG capsule Take 1 capsule (220 mg total) by mouth daily for 10 days. Start taking on: January 04, 2019          TOTAL DISCHARGE TIME: 5 minutes  Kelvis Berger Rito EhrlichKrishnan  [facility] Pager on [email or web address]  01/03/2019, 1:36 PM

## 2019-01-03 NOTE — Discharge Instructions (Signed)
COVID-19: How to Protect Yourself and Others Know how it spreads  There is currently no vaccine to prevent coronavirus disease 2019 (COVID-19).  The best way to prevent illness is to avoid being exposed to this virus.  The virus is thought to spread mainly from person-to-person. ? Between people who are in close contact with one another (within about 6 feet). ? Through respiratory droplets produced when an infected person coughs, sneezes or talks. ? These droplets can land in the mouths or noses of people who are nearby or possibly be inhaled into the lungs. ? Some recent studies have suggested that COVID-19 may be spread by people who are not showing symptoms. Everyone should Clean your hands often  Wash your hands often with soap and water for at least 20 seconds especially after you have been in a public place, or after blowing your nose, coughing, or sneezing.  If soap and water are not readily available, use a hand sanitizer that contains at least 60% alcohol. Cover all surfaces of your hands and rub them together until they feel dry.  Avoid touching your eyes, nose, and mouth with unwashed hands. Avoid close contact  Stay home if you are sick.  Avoid close contact with people who are sick.  Put distance between yourself and other people. ? Remember that some people without symptoms may be able to spread virus. ? This is especially important for people who are at higher risk of getting very sick.www.cdc.gov/coronavirus/2019-ncov/need-extra-precautions/people-at-higher-risk.html Cover your mouth and nose with a cloth face cover when around others  You could spread COVID-19 to others even if you do not feel sick.  Everyone should wear a cloth face cover when they have to go out in public, for example to the grocery store or to pick up other necessities. ? Cloth face coverings should not be placed on young children under age 2, anyone who has trouble breathing, or is  unconscious, incapacitated or otherwise unable to remove the mask without assistance.  The cloth face cover is meant to protect other people in case you are infected.  Do NOT use a facemask meant for a healthcare worker.  Continue to keep about 6 feet between yourself and others. The cloth face cover is not a substitute for social distancing. Cover coughs and sneezes  If you are in a private setting and do not have on your cloth face covering, remember to always cover your mouth and nose with a tissue when you cough or sneeze or use the inside of your elbow.  Throw used tissues in the trash.  Immediately wash your hands with soap and water for at least 20 seconds. If soap and water are not readily available, clean your hands with a hand sanitizer that contains at least 60% alcohol. Clean and disinfect  Clean AND disinfect frequently touched surfaces daily. This includes tables, doorknobs, light switches, countertops, handles, desks, phones, keyboards, toilets, faucets, and sinks. www.cdc.gov/coronavirus/2019-ncov/prevent-getting-sick/disinfecting-your-home.html  If surfaces are dirty, clean them: Use detergent or soap and water prior to disinfection.  Then, use a household disinfectant. You can see a list of EPA-registered household disinfectants here. cdc.gov/coronavirus 11/09/2018 This information is not intended to replace advice given to you by your health care provider. Make sure you discuss any questions you have with your health care provider. Document Released: 10/19/2018 Document Revised: 11/17/2018 Document Reviewed: 10/19/2018 Elsevier Patient Education  2020 [employer] Inc. COVID-19 COVID-19 is a respiratory infection that is caused by a virus called severe acute respiratory syndrome coronavirus   2 (SARS-CoV-2). The disease is also known as coronavirus disease or novel coronavirus. In some people, the virus may not cause any symptoms. In others, it may cause a serious infection.  The infection can get worse quickly and can lead to complications, such as:  Pneumonia, or infection of the lungs.  Acute respiratory distress syndrome or ARDS. This is fluid build-up in the lungs.  Acute respiratory failure. This is a condition in which there is not enough oxygen passing from the lungs to the body.  Sepsis or septic shock. This is a serious bodily reaction to an infection.  Blood clotting problems.  Secondary infections due to bacteria or fungus. The virus that causes COVID-19 is contagious. This means that it can spread from person to person through droplets from coughs and sneezes (respiratory secretions). What are the causes? This illness is caused by a virus. You may catch the virus by:  Breathing in droplets from an infected person's cough or sneeze.  Touching something, like a table or a doorknob, that was exposed to the virus (contaminated) and then touching your mouth, nose, or eyes. What increases the risk? Risk for infection You are more likely to be infected with this virus if you:  Live in or travel to an area with a COVID-19 outbreak.  Come in contact with a sick person who recently traveled to an area with a COVID-19 outbreak.  Provide care for or live with a person who is infected with COVID-19. Risk for serious illness You are more likely to become seriously ill from the virus if you:  Are 65 years of age or older.  Have a long-term disease that lowers your body's ability to fight infection (immunocompromised).  Live in a nursing home or long-term care facility.  Have a long-term (chronic) disease such as: ? Chronic lung disease, including chronic obstructive pulmonary disease or asthma ? Heart disease. ? Diabetes. ? Chronic kidney disease. ? Liver disease.  Are obese. What are the signs or symptoms? Symptoms of this condition can range from mild to severe. Symptoms may appear any time from 2 to 14 days after being exposed to the virus.  They include:  A fever.  A cough.  Difficulty breathing.  Chills.  Muscle pains.  A sore throat.  Loss of taste or smell. Some people may also have stomach problems, such as nausea, vomiting, or diarrhea. Other people may not have any symptoms of COVID-19. How is this diagnosed? This condition may be diagnosed based on:  Your signs and symptoms, especially if: ? You live in an area with a COVID-19 outbreak. ? You recently traveled to or from an area where the virus is common. ? You provide care for or live with a person who was diagnosed with COVID-19.  A physical exam.  Lab tests, which may include: ? A nasal swab to take a sample of fluid from your nose. ? A throat swab to take a sample of fluid from your throat. ? A sample of mucus from your lungs (sputum). ? Blood tests.  Imaging tests, which may include, X-rays, CT scan, or ultrasound. How is this treated? At present, there is no medicine to treat COVID-19. Medicines that treat other diseases are being used on a trial basis to see if they are effective against COVID-19. Your health care provider will talk with you about ways to treat your symptoms. For most people, the infection is mild and can be managed at home with rest, fluids, and over-the-counter medicines.   Treatment for a serious infection usually takes places in a hospital intensive care unit (ICU). It may include one or more of the following treatments. These treatments are given until your symptoms improve.  Receiving fluids and medicines through an IV.  Supplemental oxygen. Extra oxygen is given through a tube in the nose, a face mask, or a hood.  Positioning you to lie on your stomach (prone position). This makes it easier for oxygen to get into the lungs.  Continuous positive airway pressure (CPAP) or bi-level positive airway pressure (BPAP) machine. This treatment uses mild air pressure to keep the airways open. A tube that is connected to a motor  delivers oxygen to the body.  Ventilator. This treatment moves air into and out of the lungs by using a tube that is placed in your windpipe.  Tracheostomy. This is a procedure to create a hole in the neck so that a breathing tube can be inserted.  Extracorporeal membrane oxygenation (ECMO). This procedure gives the lungs a chance to recover by taking over the functions of the heart and lungs. It supplies oxygen to the body and removes carbon dioxide. Follow these instructions at home: Lifestyle  If you are sick, stay home except to get medical care. Your health care provider will tell you how long to stay home. Call your health care provider before you go for medical care.  Rest at home as told by your health care provider.  Do not use any products that contain nicotine or tobacco, such as cigarettes, e-cigarettes, and chewing tobacco. If you need help quitting, ask your health care provider.  Return to your normal activities as told by your health care provider. Ask your health care provider what activities are safe for you. General instructions  Take over-the-counter and prescription medicines only as told by your health care provider.  Drink enough fluid to keep your urine pale yellow.  Keep all follow-up visits as told by your health care provider. This is important. How is this prevented?  There is no vaccine to help prevent COVID-19 infection. However, there are steps you can take to protect yourself and others from this virus. To protect yourself:   Do not travel to areas where COVID-19 is a risk. The areas where COVID-19 is reported change often. To identify high-risk areas and travel restrictions, check the CDC travel website: wwwnc.cdc.gov/travel/notices  If you live in, or must travel to, an area where COVID-19 is a risk, take precautions to avoid infection. ? Stay away from people who are sick. ? Wash your hands often with soap and water for 20 seconds. If soap and water  are not available, use an alcohol-based hand sanitizer. ? Avoid touching your mouth, face, eyes, or nose. ? Avoid going out in public, follow guidance from your state and local health authorities. ? If you must go out in public, wear a cloth face covering or face mask. ? Disinfect objects and surfaces that are frequently touched every day. This may include:  Counters and tables.  Doorknobs and light switches.  Sinks and faucets.  Electronics, such as phones, remote controls, keyboards, computers, and tablets. To protect others: If you have symptoms of COVID-19, take steps to prevent the virus from spreading to others.  If you think you have a COVID-19 infection, contact your health care provider right away. Tell your health care team that you think you may have a COVID-19 infection.  Stay home. Leave your house only to seek medical care. Do not   use public transport.  Do not travel while you are sick.  Wash your hands often with soap and water for 20 seconds. If soap and water are not available, use alcohol-based hand sanitizer.  Stay away from other members of your household. Let healthy household members care for children and pets, if possible. If you have to care for children or pets, wash your hands often and wear a mask. If possible, stay in your own room, separate from others. Use a different bathroom.  Make sure that all people in your household wash their hands well and often.  Cough or sneeze into a tissue or your sleeve or elbow. Do not cough or sneeze into your hand or into the air.  Wear a cloth face covering or face mask. Where to find more information  Centers for Disease Control and Prevention: www.cdc.gov/coronavirus/2019-ncov/index.html  World Health Organization: www.who.int/health-topics/coronavirus Contact a health care provider if:  You live in or have traveled to an area where COVID-19 is a risk and you have symptoms of the infection.  You have had contact  with someone who has COVID-19 and you have symptoms of the infection. Get help right away if:  You have trouble breathing.  You have pain or pressure in your chest.  You have confusion.  You have bluish lips and fingernails.  You have difficulty waking from sleep.  You have symptoms that get worse. These symptoms may represent a serious problem that is an emergency. Do not wait to see if the symptoms will go away. Get medical help right away. Call your local emergency services (911 in the [place] Do not drive yourself to the hospital. Let the emergency medical personnel know if you think you have COVID-19. Summary  COVID-19 is a respiratory infection that is caused by a virus. It is also known as coronavirus disease or novel coronavirus. It can cause serious infections, such as pneumonia, acute respiratory distress syndrome, acute respiratory failure, or sepsis.  The virus that causes COVID-19 is contagious. This means that it can spread from person to person through droplets from coughs and sneezes.  You are more likely to develop a serious illness if you are 65 years of age or older, have a weak immunity, live in a nursing home, or have chronic disease.  There is no medicine to treat COVID-19. Your health care provider will talk with you about ways to treat your symptoms.  Take steps to protect yourself and others from infection. Wash your hands often and disinfect objects and surfaces that are frequently touched every day. Stay away from people who are sick and wear a mask if you are sick. This information is not intended to replace advice given to you by your health care provider. Make sure you discuss any questions you have with your health care provider. Document Released: 07/29/2018 Document Revised: 11/18/2018 Document Reviewed: 07/29/2018 Elsevier Patient Education  2020 Elsevier Inc.  

## 2019-01-03 NOTE — Progress Notes (Signed)
Patient given discharge instructions and verballizes understanding. States she will self isolate for 14 days. Patient d/c via WC to car.

## 2019-01-03 NOTE — Consult Note (Signed)
   [facility] Inpatient Consult   01/03/2019  Molly Summers [facility] January 09, 1976 [id number]   Patient will be followed by [facility] as part of the insurance plan.    Chart reviewed for needs and patient history is as follows per MD discharge summary notes:  43 y.o.femalewith medical history significantforhypertension, hyperlipidemia and GERD who has been having symptoms of cough, body ache, chills for past 11 days and has been having intermittent low-grade fever for few days which became high-grade with a T-max being 103.1 at home and was actually diagnosed with COVID 19+8 days agopresentedto the emergency department to seek further care as her symptoms have been getting worse with cough and production of yellow sputum and recurrent fever unable to be controlled with antipyretics. She was noted to be hypoxic. She was hospitalized for further management.  Multiple attempts to reach the patient via patient hospital room was not available to notify patient of post hospital follow up with [place] management.  Spoke with inpatient TOC RNCM, Samantha, regarding attempts.  For questions, please contact:  Natividad Brood, RN BSN [facility] Liaison  [phone] business mobile phone Toll free office [phone]  Fax number: [phone] [email or web address]

## 2019-01-04 LAB — CULTURE, BLOOD (ROUTINE X 2)
Culture: NO GROWTH
Culture: NO GROWTH
Special Requests: ADEQUATE
Special Requests: ADEQUATE

## 2019-01-05 ENCOUNTER — Other Ambulatory Visit: Payer: Self-pay | Admitting: *Deleted

## 2019-01-05 ENCOUNTER — Ambulatory Visit (INDEPENDENT_AMBULATORY_CARE_PROVIDER_SITE_OTHER): Payer: 59 | Admitting: Family Medicine

## 2019-01-05 ENCOUNTER — Other Ambulatory Visit: Payer: Self-pay

## 2019-01-05 DIAGNOSIS — I10 Essential (primary) hypertension: Secondary | ICD-10-CM

## 2019-01-05 DIAGNOSIS — R739 Hyperglycemia, unspecified: Secondary | ICD-10-CM

## 2019-01-05 DIAGNOSIS — R740 Nonspecific elevation of levels of transaminase and lactic acid dehydrogenase [LDH]: Secondary | ICD-10-CM | POA: Diagnosis not present

## 2019-01-05 DIAGNOSIS — R7401 Elevation of levels of liver transaminase levels: Secondary | ICD-10-CM

## 2019-01-05 DIAGNOSIS — R7989 Other specified abnormal findings of blood chemistry: Secondary | ICD-10-CM

## 2019-01-05 DIAGNOSIS — E785 Hyperlipidemia, unspecified: Secondary | ICD-10-CM | POA: Diagnosis not present

## 2019-01-05 DIAGNOSIS — U071 COVID-19: Secondary | ICD-10-CM

## 2019-01-05 HISTORY — DX: Essential (primary) hypertension: I10

## 2019-01-05 NOTE — Patient Outreach (Addendum)
[facility]) Care Management  01/05/2019  Molly Summers Jul 22, 1975 [id number]  Transition of care telephone call  Referral received: 12/31/18 Initial outreach: 01/05/19 Insurance: [employer]  Initial unsuccessful telephone call to patient's home/mobile number in order to complete transition of care assessment; no answer, left HIPAA compliant voicemail message requesting return call.   Objective: Per the electronic medical record, Molly Summers was hospitalized at the [facility] from 6/25-6/29/20 for Covid 19 infection.  Comorbidities include: HTN, hyperlipidemia, depression and obesity She was discharged to home on 01/03/19 without the need for home health services or durable medical equipment per the discharge summary.  She completed a telehealth visit to establish primary care with Dr. Merceda Elks today.  Plan: This RNCM will route unsuccessful outreach letter with [facility] pamphlet and 24 hour Nurse Advice Line Magnet to [facility] clinical pool to be mailed to patient's home address. RNCM will attempt another outreach within 4 business days.  Barrington Ellison RN,CCM,CDE [place] Management Coordinator Office Phone [phone] Office Fax [phone]

## 2019-01-05 NOTE — Progress Notes (Signed)
Virtual Visit via Video Note   I connected with Molly Summers on 01/07/19 at  8:00 AM EDT by a video enabled telemedicine application and verified that I am speaking with the correct person using two identifiers.  Location patient: home Location provider:home office Persons participating in the virtual visit: patient, provider  I discussed the limitations of evaluation and management by telemedicine and the availability of in person appointments. The patient expressed understanding and agreed to proceed.   HPI: Molly Summers is a 43 yo female establishing care today.  Former PCP: Not for the past couple years. Last CPE in 2018.  Hx of HLD,obesity,depression,HTN,and GERD among some.  Recently discharged from hospital,complications from [place] 19 infection. She was admitted on 12/30/18 ARD due to COVID 19,hypoxic respiratory failure.  Discharged home on 01/03/19, today 2nd day of discharge.. [facility] call 01/05/19.  Presented to the ER in the day of admission because persistent fever and productive cough.  Elevated D Dimer and CRP,improved at the time of discharge.  In hospital treated with Remdesvir and steroids  She is on Prednisone taper. States that Prednisone is making her "crazy", denies suicidal thoughts. No other side effects reported.  Checking daily temp and has not noted fever. + Fatigue and still decreased appetite but slowly getting better. Mild non productive cough.  During her hospitalization noted NSVT. Hx of PVC's. She has seen cardiologist a few years ago.  She has not noted CP,dyspnea, palpitations,dizziness/syncope,diaphoresis. During hospitalization TSH normal but T4 mildly elevated. No Hx of thyroid disease. Negative for tremor,abnormal wt loss,heat/cold intolerance,or constipation.diarrhea.  HTN,she is on Amlodipine  5 mg daily, she is not checking BP regularly. Negative for unusual headache,visual changes,or edema.  Lab Results  Component Value Date    CREATININE 0.85 01/03/2019   BUN 23 (H) 01/03/2019   NA 141 01/03/2019   K 3.8 01/03/2019   CL 107 01/03/2019   CO2 22 01/03/2019   She has had elevated glucose,no Hx of DM II. 140's.  HLD,she is not on pharmacologic treatment. She has not been consistent with following a healthful diet or with regular exercise.  03/2017:  LDL Direct 181 (H) <150 mg/dL  Total Cholesterol 257 (H) 25 - 199 MG/DL  Triglycerides 79 10 - 150 MG/DL  HDL Cholesterol 74 35 - 135 MG/DL  Total Chol / HDL Cholesterol 3.5 <=4.5   Non-HDL Cholesterol     Mildly abnormal ALT. No Hx of high alcohol intake.  Lab Results  Component Value Date   ALT 68 (H) 01/03/2019   AST 35 01/03/2019   ALKPHOS 48 01/03/2019   BILITOT 0.3 01/03/2019    Lab Results  Component Value Date   TSH 0.398 01/02/2019   Former [place] in 06/2017.   ROS: See pertinent positives and negatives per HPI.  Past Medical History:  Diagnosis Date  . Depression   . High blood cholesterol level   . Hypertension     Past Surgical History:  Procedure Laterality Date  . LAPAROSCOPIC OVARIAN Left     Family History  Problem Relation Age of Onset  . Hyperlipidemia Mother   . Hypertension Mother   . Hypertension Father   . Hyperlipidemia Father   . Cancer Neg Hx   . Diabetes Neg Hx     Social History   Socioeconomic History  . Marital status: Married    Spouse name: Not on file  . Number of children: Not on file  . Years of education: Not on file  . Highest  education level: Not on file  Occupational History  . Not on file  Social Needs  . Financial resource strain: Not on file  . Food insecurity    Worry: Not on file    Inability: Not on file  . Transportation needs    Medical: Not on file    Non-medical: Not on file  Tobacco Use  . Smoking status: Former Smoker    Years: 24.00    Types: Cigarettes    Quit date: 07/06/2018    Years since quitting: 0.5  . Smokeless tobacco: Never Used  . Tobacco  comment: intermittent tobacco use since 43 yo.  Substance and Sexual Activity  . Alcohol use: Not Currently    Frequency: Never  . Drug use: Never  . Sexual activity: Not on file  Lifestyle  . Physical activity    Days per week: Not on file    Minutes per session: Not on file  . Stress: Not on file  Relationships  . Social Musicianconnections    Talks on phone: Not on file    Gets together: Not on file    Attends religious service: Not on file    Active member of club or organization: Not on file    Attends meetings of clubs or organizations: Not on file    Relationship status: Not on file  . Intimate partner violence    Fear of current or ex partner: Not on file    Emotionally abused: Not on file    Physically abused: Not on file    Forced sexual activity: Not on file  Other Topics Concern  . Not on file  Social History Narrative  . Not on file     Current Outpatient Medications:  .  amLODipine (NORVASC) 5 MG tablet, Take 1 tablet (5 mg total) by mouth daily. (Patient not taking: Reported on 12/30/2018), Disp: 30 tablet, Rfl: 0 .  benzonatate (TESSALON) 100 MG capsule, Take 1 capsule (100 mg total) by mouth 3 (three) times daily as needed for cough., Disp: 30 capsule, Rfl: 0 .  famotidine (PEPCID) 20 MG tablet, Take 20 mg by mouth 2 (two) times daily., Disp: , Rfl:  .  predniSONE (DELTASONE) 20 MG tablet, Take 3 tablets once daily for 4 days, then take 2 tablets once daily for 4 days, then take 1 tablet once daily for 4 days, then STOP., Disp: 24 tablet, Rfl: 0 .  vitamin C (VITAMIN C) 500 MG tablet, Take 1 tablet (500 mg total) by mouth daily for 10 days., Disp: 10 tablet, Rfl: 0 .  zinc sulfate 220 (50 Zn) MG capsule, Take 1 capsule (220 mg total) by mouth daily for 10 days., Disp: 10 capsule, Rfl: 0  EXAM:  VITALS per patient if applicable:LMP 12/30/2018 Comment: irregular, s/p endometrial ablation.  GENERAL: alert, oriented, appears well and in no acute distress  HEENT:  atraumatic, conjunctiva clear, no obvious abnormalities on inspection of face,external nose, and ears  NECK: normal movements of the head and neck  LUNGS: on inspection no signs of respiratory distress, breathing rate appears normal, no obvious gross SOB, gasping or wheezing  CV: no obvious cyanosis  Molly: moves all visible extremities without noticeable abnormality  PSYCH/NEURO: pleasant and cooperative, no obvious depression or anxiety, speech and thought processing grossly intact  ASSESSMENT AND PLAN:  Discussed the following assessment and plan:  Hypertension, essential, benign - Plan:Recommend monitoring BP regularly. Adequately controlled. No changes in Amlodipine dose.  COVID-19 virus infection - Plan: Improved.  Continue monitoring temp and for worsening symptoms. Explained that cough can last a few more days.  Side effects of Prednisone discussed. Recommended taking it with breakfast. If still having side effects decrease dose to 1/2. Instructed about warning signs.  Hyperglycemia - Plan: Hemoglobin A1c,  Healthy life style for primary prevention recommended.  Abnormal thyroid blood test - Plan: T4, free, TSH. Asymptomatic. Caused by acute illness most likely. Other possible causes discussed. Further recommendations according to lab results.  Hyperlipidemia, unspecified hyperlipidemia type - Plan: Lipid panel Low fat diet recommended for now. Lab appt will be arrange and further recommendations will be given according to 10 years CVD score and/or lipid panel results.  Elevated transaminase level - Plan: Hepatic function panel Could be related to acute illness. Will re-check LFT's with next blood work.    I discussed the assessment and treatment plan with the patient. She was provided an opportunity to ask questions and all were answered. The patient agreed with the plan and demonstrated an understanding of the instructions.   The patient was advised to call back  or seek an in-person evaluation if the symptoms worsen or if the condition fails to improve as anticipated.  Return in about 5 months (around 06/07/2019) for 5-6 months cpe.     [place], MD

## 2019-01-06 ENCOUNTER — Encounter: Payer: Self-pay | Admitting: Family Medicine

## 2019-01-07 ENCOUNTER — Encounter: Payer: Self-pay | Admitting: Family Medicine

## 2019-01-10 ENCOUNTER — Ambulatory Visit: Payer: 59 | Admitting: *Deleted

## 2019-01-17 ENCOUNTER — Other Ambulatory Visit: Payer: Self-pay | Admitting: *Deleted

## 2019-01-17 NOTE — Patient Outreach (Signed)
[facility]) Care Management  01/17/2019  Molly Summers [facility] 1975-11-12 [id number]   Transition of care telephone call  Referral received: 12/31/18 Initial outreach: 01/05/19 Insurance: [place] Choice Plan  Subjective: Molly Summers via her mobile number in order to complete transition of care assessment; she states she was grocery shopping and requests a call back tomorrow.  Objective: Per the electronic medical record, Molly Summers was hospitalized at the [facility] from 6/25-6/29/20 for Covid 19 infection.  Comorbidities include: HTN, hyperlipidemia, depression and obesity She was discharged to home on 01/03/19 without the need for home health services or durable medical equipment per the discharge summary.  She completed a telehealth visit to establish primary care with Molly Summers on 01/05/19.  Plan: Per Molly Summers's request, this RNCM will make another outreach attempt on 01/18/19.  Molly Ellison RN,CCM,CDE [place] Management Coordinator Office Phone [phone] Office Fax [phone]

## 2019-01-18 ENCOUNTER — Encounter: Payer: Self-pay | Admitting: *Deleted

## 2019-01-18 ENCOUNTER — Other Ambulatory Visit: Payer: Self-pay | Admitting: *Deleted

## 2019-01-18 NOTE — Patient Outreach (Signed)
[facility]) Care Management  01/18/2019  Molly Summers [facility] March 05, 1976 [id number]  Transition of care telephone call/Case Closure  Referral received: 12/31/18 Initial outreach: 01/05/19 Insurance: [place] Choice Plan  Subjective: Successful telephone call to patient's preferred number in order to complete transition of care assessment; 2 HIPAA identifiers verified. Explained purpose of call and completed transition of care assessment.  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 She says she followed quarantine guidelines upon her discharge from the hospital on 6/29. She denies cough, fever, chills and says she is not participating in the Covid 19 home monitoring via My Chart. She says she does not know if she has the hospital indemnity voluntary insurance and she would welcome information on how to determine if she does and she would also like information emailed to her on the criteria to meet the healthy lifestyle insurance premium discount in 2021.   Objective: Per the electronic medical record,[facility] Comorbidities include:HTN, hyperlipidemia, depression and obesity She was discharged to home on6/29/20 without the need for home health services or durable medical equipment per the discharge summary. She completed a telehealth visit to establish primary care with Dr. Joya San 01/05/19.  Assessment:  Patient voices good understanding of all discharge instructions.  See transition of care flowsheet for assessment details.  Plan:  Reviewed hospital discharge diagnosis of positive Covid 19 infection and treatment plan using hospital discharge instructions, assessing medication adherence, reviewing problems requiring provider notification, and  discussing the importance of follow up with her primary care provider. Reviewed [place] [employer] Management 2020 Wellness Requirements of: Completing the computerized Health Assessment and the Health Action Step with Active Health Management [facility]) by March 08 2019 AND have an annual physical between July 07, 2017 and March 08, 2019 in order to qualify for the 2021 Healthy Lifestyle Premium. E-mailed the information to [employer] personal e-mail address per her request. Emailed the link to "[employer]" so that Molly Summers can determine if she purchased the [employer]. Reviewed [place]'s chronic disease management program benefit with Active Heath Management and encouraged her to contact them at [phone] or at [email or web address] to enroll or for questions about managing her chronic disease states. No ongoing care management needs identified so will close case to [facility] services.  Barrington Ellison RN,CCM,CDE [place] Management Coordinator Office Phone [phone] Office Fax [phone]

## 2019-01-18 NOTE — Patient Outreach (Signed)
[facility]) Care Management  01/18/2019  Eliana Lueth [facility] Sep 24, 1975 [id number]  Encounter opened in error. Please see other [facility] CM note of today.  Barrington Ellison RN,CCM,CDE [place] Management Coordinator Office Phone [phone] Office Fax [phone]

## 2019-03-10 ENCOUNTER — Other Ambulatory Visit: Payer: Self-pay | Admitting: Family Medicine

## 2019-03-10 DIAGNOSIS — I10 Essential (primary) hypertension: Secondary | ICD-10-CM

## 2019-03-10 NOTE — Telephone Encounter (Signed)
Requested medication (s) are due for refill today: yes  Requested medication (s) are on the active medication list: yes  Last refill:  08/11/2018  Future visit scheduled: no  Notes to clinic: review for refill Ordering provider and pcp are different   Requested Prescriptions  Pending Prescriptions Disp Refills   amLODipine (NORVASC) 5 MG tablet 30 tablet 0    Sig: Take 1 tablet (5 mg total) by mouth daily.     Cardiovascular:  Calcium Channel Blockers Failed - 03/10/2019  2:11 PM      Failed - Last BP in normal range    BP Readings from Last 1 Encounters:  01/03/19 (!) 145/104         Passed - Valid encounter within last 6 months    Recent Outpatient Visits          2 months ago Hypertension, essential, benign   Therapist, music at [facility], Malka So, MD   2 months ago COVID-19 virus infection   Therapist, music at [facility], Alinda Sierras, MD

## 2019-03-10 NOTE — Telephone Encounter (Signed)
Copied from [place] [id number]. Topic: Quick Communication - Rx Refill/Question >> Mar 10, 2019  2:07 PM Leward Quan A wrote: Medication: amLODipine (NORVASC) 5 MG tablet   Has the patient contacted their pharmacy? Yes.   (Agent: If no, request that the patient contact the pharmacy for the refill.) (Agent: If yes, when and what did the pharmacy advise?)  Preferred Pharmacy (with phone number or street name): [place], [place] - [place] [phone] (Phone) [phone] (Fax)    Agent: Please be advised that RX refills may take up to 3 business days. We ask that you follow-up with your pharmacy.

## 2019-03-11 MED ORDER — AMLODIPINE BESYLATE 5 MG PO TABS: 5.0000 mg | ORAL_TABLET | Freq: Every day | ORAL | 0 refills | Status: DC

## 2019-03-11 NOTE — Telephone Encounter (Signed)
See request °

## 2019-04-11 ENCOUNTER — Other Ambulatory Visit: Payer: Self-pay | Admitting: Family Medicine

## 2019-04-11 DIAGNOSIS — I10 Essential (primary) hypertension: Secondary | ICD-10-CM

## 2019-04-11 MED ORDER — AMLODIPINE BESYLATE 5 MG PO TABS: 5.0000 mg | ORAL_TABLET | Freq: Every day | ORAL | 0 refills | Status: DC

## 2019-04-11 NOTE — Telephone Encounter (Signed)
Medication Refill - Medication: amLODipine (NORVASC) 5 MG tablet    Has the patient contacted their pharmacy? Yes.   (Agent: If no, request that the patient contact the pharmacy for the refill.) (Agent: If yes, when and what did the pharmacy advise?)  Preferred Pharmacy (with phone number or street name):  [place], [place] - [address]  [place] [address]  Phone: [phone] Fax: [phone]     Agent: Please be advised that RX refills may take up to 3 business days. We ask that you follow-up with your pharmacy.

## 2019-04-28 MED ORDER — AMLODIPINE BESYLATE 5 MG PO TABS: 5.0000 mg | ORAL_TABLET | Freq: Every day | ORAL | 0 refills | Status: DC

## 2019-04-28 MED FILL — AMLODIPINE BESYLATE 5 MG TA: 5 | 30 days supply | Qty: 30 | Fill #0

## 2019-04-28 NOTE — Addendum Note (Signed)
Addended by: Jefferson Fuel on: 04/28/2019 02:10 PM   Modules accepted: Orders

## 2019-04-28 NOTE — Telephone Encounter (Signed)
Pt is following up on request to have almodipine resent to  [facility], [place] [phone] (Phone) [phone] (Fax)   Pt's insurance will not cover [facility] which is where it was sent.  Requesting it to be sent to [facility] today.

## 2019-07-28 DIAGNOSIS — R5381 Other malaise: Secondary | ICD-10-CM | POA: Diagnosis not present

## 2019-07-28 DIAGNOSIS — R5383 Other fatigue: Secondary | ICD-10-CM

## 2019-07-28 DIAGNOSIS — E669 Obesity, unspecified: Secondary | ICD-10-CM | POA: Diagnosis not present

## 2019-07-28 DIAGNOSIS — R002 Palpitations: Secondary | ICD-10-CM

## 2019-07-28 DIAGNOSIS — I10 Essential (primary) hypertension: Secondary | ICD-10-CM | POA: Diagnosis not present

## 2019-07-28 DIAGNOSIS — F902 Attention-deficit hyperactivity disorder, combined type: Secondary | ICD-10-CM | POA: Diagnosis not present

## 2019-07-28 DIAGNOSIS — F332 Major depressive disorder, recurrent severe without psychotic features: Secondary | ICD-10-CM | POA: Diagnosis not present

## 2019-07-28 DIAGNOSIS — E782 Mixed hyperlipidemia: Secondary | ICD-10-CM | POA: Diagnosis not present

## 2019-07-28 HISTORY — DX: Other malaise: R53.81

## 2019-07-28 HISTORY — DX: Palpitations: R00.2

## 2019-07-28 HISTORY — DX: Other fatigue: R53.83

## 2019-07-28 MED FILL — ESCITALOPRAM 20 MG TABLET: 20 | 90 days supply | Qty: 90 | Fill #0

## 2019-07-28 MED FILL — AMLODIPINE BESYLATE 10 MG T: 10 | 90 days supply | Qty: 90 | Fill #0

## 2019-07-29 DIAGNOSIS — R002 Palpitations: Secondary | ICD-10-CM | POA: Diagnosis not present

## 2019-08-02 ENCOUNTER — Telehealth (HOSPITAL_COMMUNITY): Payer: Self-pay

## 2019-08-02 ENCOUNTER — Encounter: Payer: Self-pay | Admitting: *Deleted

## 2019-08-02 ENCOUNTER — Ambulatory Visit (INDEPENDENT_AMBULATORY_CARE_PROVIDER_SITE_OTHER): Payer: 59

## 2019-08-02 ENCOUNTER — Other Ambulatory Visit: Payer: Self-pay

## 2019-08-02 ENCOUNTER — Ambulatory Visit (INDEPENDENT_AMBULATORY_CARE_PROVIDER_SITE_OTHER): Payer: 59 | Admitting: Cardiology

## 2019-08-02 VITALS — BP 170/118 | HR 74 | Ht 64.0 in | Wt 229.0 lb

## 2019-08-02 DIAGNOSIS — R9431 Abnormal electrocardiogram [ECG] [EKG]: Secondary | ICD-10-CM | POA: Diagnosis not present

## 2019-08-02 DIAGNOSIS — R002 Palpitations: Secondary | ICD-10-CM | POA: Diagnosis not present

## 2019-08-02 DIAGNOSIS — R072 Precordial pain: Secondary | ICD-10-CM | POA: Diagnosis not present

## 2019-08-02 DIAGNOSIS — E669 Obesity, unspecified: Secondary | ICD-10-CM | POA: Diagnosis not present

## 2019-08-02 DIAGNOSIS — I10 Essential (primary) hypertension: Secondary | ICD-10-CM

## 2019-08-02 MED ORDER — HYDROCHLOROTHIAZIDE 12.5 MG PO CAPS: 12.5000 mg | ORAL_CAPSULE | Freq: Every day | ORAL | 1 refills | Status: DC

## 2019-08-02 MED FILL — HYDROCHLOROTHIAZIDE 12.5 MG: 12.5 | 90 days supply | Qty: 90 | Fill #0

## 2019-08-02 NOTE — Telephone Encounter (Signed)
Encounter complete. 

## 2019-08-02 NOTE — Patient Instructions (Signed)
Medication Instructions:  Your physician has recommended you make the following change in your medication:   START: Hydrochlorothiazide 12.5 mg Take 1 tab daily  *If you need a refill on your cardiac medications before your next appointment, please call your pharmacy*  Lab Work: None If you have labs (blood work) drawn today and your tests are completely normal, you will receive your results only by: Marland Kitchen MyChart Message (if you have MyChart) OR . A paper copy in the mail If you have any lab test that is abnormal or we need to change your treatment, we will call you to review the results.  Testing/Procedures: Your physician has requested that you have en exercise stress myoview. For further information please visit [email or web address] Please follow instruction sheet, as given.  Your physician has requested that you have an echocardiogram. Echocardiography is a painless test that uses sound waves to create images of your heart. It provides your doctor with information about the size and shape of your heart and how well your heart's chambers and valves are working. This procedure takes approximately one hour. There are no restrictions for this procedure.  A zio monitor was ordered today. It will remain on for 7 days. You will then return monitor and event diary in provided box. It takes 1-2 weeks for report to be downloaded and returned to [place]. We will call you with the results. If monitor falls off or has orange flashing light, please call Zio for further instructions.    Follow-Up: At [facility], you and your health needs are our priority.  As part of our continuing mission to provide you with exceptional heart care, we have created designated Provider Care Teams.  These Care Teams include your primary Cardiologist (physician) and Advanced Practice Providers (APPs -  Physician Assistants and Nurse Practitioners) who all work together to provide you with the care you need, when you need  it.  Your next appointment:   3 month(s)  The format for your next appointment:   In Person  Provider:   Thomasene Ripple, DO  Other Instructions

## 2019-08-02 NOTE — Progress Notes (Signed)
Cardiology Office Note:    Date:  08/02/2019   ID:  Claudine Mouton, DOB 1975-09-16, MRN [id number]  PCP:  Gweneth Fritter, FNP  Cardiologist:  Berniece Salines, DO  Electrophysiologist:  None   Referring MD: Gweneth Fritter, FNP   Chief Complaint  Patient presents with  . Palpitations  Patient referred for chest pain and palpitations.  History of Present Illness:    Molly Summers is a 44 y.o. female with a hx of hypertension on amlodipine 10 mg daily, patient was diagnosed 9 years ago with hypertension and a routine office visit.  She has been on medication ever since.  The patient was recommended by her PCP to be seen due to chest pain as well as palpitations.  She tells me the last 3 weeks she has been experiencing new onset chest pressure which is diffusely sternal without radiation.  Nuys any associated shortness of breath, nausea or vomiting.  She says that this is concerning as she is usually an active person but she has been experiencing this almost couple times a week.    In addition he was told in the past that she did have received an almost every night she is experiencing palpitations.  She notes that is worse at night but denies any dizziness or lightheadedness.  This sensation is almost every day.  He offers no other complaints at this time.  Of note the patient was treated for covid infection in June 2020.  Past Medical History:  Diagnosis Date  . Attention deficit hyperactivity disorder (ADHD), combined type 04/06/2017  . COVID-19 virus infection 12/30/2018  . Depression   . Heart palpitations 07/28/2019  . High blood cholesterol level   . Hypertension   . Hypertension, essential, benign 01/05/2019  . Malaise and fatigue 07/28/2019  . Obesity with body mass index (BMI) of 30.0 to 39.9 03/27/2017  . Severe episode of recurrent major depressive disorder, without psychotic features ([place]) 03/27/2017    Past Surgical History:  Procedure Laterality Date  . LAPAROSCOPIC  OVARIAN Left     Current Medications: Current Meds  Medication Sig  . amLODipine (NORVASC) 10 MG tablet Take 10 mg by mouth daily.  Marland Kitchen ascorbic acid (VITAMIN C) 1000 MG tablet Take 1,000 mg by mouth daily.  Marland Kitchen b complex vitamins capsule Take by mouth.  . Biotin 1 MG CAPS Take by mouth.  . escitalopram (LEXAPRO) 20 MG tablet Take 20 mg by mouth daily.  . famotidine (PEPCID) 20 MG tablet Take 20 mg by mouth 2 (two) times daily.  . Multiple Minerals (CALCIUM-MAGNESIUM-ZINC) TABS Take by mouth.  . Multiple Vitamins-Minerals (MULTIVITAMIN WITH MINERALS) tablet Take by mouth.  . zinc gluconate 50 MG tablet Take 50 mg by mouth daily.  . [DISCONTINUED] vitamin C (ASCORBIC ACID) 500 MG tablet Take 1,000 mg by mouth daily.      Allergies:   Patient has no known allergies.   Social History   Socioeconomic History  . Marital status: Married    Spouse name: Not on file  . Number of children: Not on file  . Years of education: Not on file  . Highest education level: Not on file  Occupational History  . Not on file  Tobacco Use  . Smoking status: Former Smoker    Years: 24.00    Types: Cigarettes    Quit date: 07/06/2018    Years since quitting: 1.0  . Smokeless tobacco: Never Used  . Tobacco comment: intermittent tobacco use since 44  yo.  Substance and Sexual Activity  . Alcohol use: Not Currently  . Drug use: Never  . Sexual activity: Not on file  Other Topics Concern  . Not on file  Social History Narrative  . Not on file   Social Determinants of Health   Financial Resource Strain:   . Difficulty of Paying Living Expenses: Not on file  Food Insecurity:   . Worried About Programme researcher, broadcasting/film/video in the Last Year: Not on file  . Ran Out of Food in the Last Year: Not on file  Transportation Needs:   . Lack of Transportation (Medical): Not on file  . Lack of Transportation (Non-Medical): Not on file  Physical Activity:   . Days of Exercise per Week: Not on file  . Minutes of  Exercise per Session: Not on file  Stress:   . Feeling of Stress : Not on file  Social Connections:   . Frequency of Communication with Friends and Family: Not on file  . Frequency of Social Gatherings with Friends and Family: Not on file  . Attends Religious Services: Not on file  . Active Member of Clubs or Organizations: Not on file  . Attends Banker Meetings: Not on file  . Marital Status: Not on file     Family History: The patient's family history includes Hyperlipidemia in her father and mother; Hypertension in her father and mother. There is no history of Cancer or Diabetes.  ROS:   Review of Systems  Constitution: Negative for decreased appetite, fever and weight gain.  HENT: Negative for congestion, ear discharge, hoarse voice and sore throat.   Eyes: Negative for discharge, redness, vision loss in right eye and visual halos.  Cardiovascular: Reports chest pain and palpitations.  Negative for dyspnea on exertion, leg swelling, orthopnea.Marland Kitchen  Respiratory: Negative for cough, hemoptysis, shortness of breath and snoring.   Endocrine: Negative for heat intolerance and polyphagia.  Hematologic/Lymphatic: Negative for bleeding problem. Does not bruise/bleed easily.  Skin: Negative for flushing, nail changes, rash and suspicious lesions.  Musculoskeletal: Negative for arthritis, joint pain, muscle cramps, myalgias, neck pain and stiffness.  Gastrointestinal: Negative for abdominal pain, bowel incontinence, diarrhea and excessive appetite.  Genitourinary: Negative for decreased libido, genital sores and incomplete emptying.  Neurological: Negative for brief paralysis, focal weakness, headaches and loss of balance.  Psychiatric/Behavioral: Negative for altered mental status, depression and suicidal ideas.  Allergic/Immunologic: Negative for HIV exposure and persistent infections.    EKGs/Labs/Other Studies Reviewed:    The following studies were reviewed  today:   EKG:  The ekg ordered today demonstrates sinus rhythm, heart rate 74 bpm with poor R wave progression in the precordial leads which could be suggestive of anterior infarction.  Recent Labs: 12/30/2018: B Natriuretic Peptide 73.4 01/02/2019: Magnesium 2.3; TSH 0.398 01/03/2019: ALT 68; BUN 23; Creatinine, Ser 0.85; Hemoglobin 13.3; Platelets 459; Potassium 3.8; Sodium 141  Recent Lipid Panel No results found for: CHOL, TRIG, HDL, CHOLHDL, VLDL, LDLCALC, LDLDIRECT  Physical Exam:    VS:  BP (!) 170/118 (BP Location: Right Arm, Patient Position: Sitting, Cuff Size: Large)   Pulse 74   Ht 5\' 4"  (1.626 m)   Wt 229 lb (103.9 kg)   SpO2 96%   BMI 39.31 kg/m     Wt Readings from Last 3 Encounters:  08/02/19 229 lb (103.9 kg)  01/03/19 224 lb 12.8 oz (102 kg)  08/11/18 224 lb (101.6 kg)     GEN: Well nourished, well developed  in no acute distress HEENT: Normal NECK: No JVD; No carotid bruits LYMPHATICS: No lymphadenopathy CARDIAC: S1S2 noted,RRR, no murmurs, rubs, gallops RESPIRATORY:  Clear to auscultation without rales, wheezing or rhonchi  ABDOMEN: Soft, non-tender, non-distended, +bowel sounds, no guarding. EXTREMITIES: No edema, No cyanosis, no clubbing MUSCULOSKELETAL:  No deformity  SKIN: Warm and dry NEUROLOGIC:  Alert and oriented x 3, non-focal PSYCHIATRIC:  Normal affect, good insight  ASSESSMENT:    1. Hypertension, unspecified type   2. Precordial pain   3. Palpitations   4. Abnormal EKG    PLAN:     1.  Chest pain/pressure with abnormal EKG is concerning therefore at this time I like to pursue an ischemic evaluation.  I discussed with patient about exercise nuclear stress test she is agreeable to proceed with this testing.  Sublingual nitroglycerin prescription was sent, its protocol and 911 protocol explained and the patient vocalized understanding questions were answered to the patient's satisfaction.  2.  I would like to rule out a cardiovascular  etiology of this palpitation, therefore at this time I would like to placed a zio patch for 7 days. In additon a transthoracic echocardiogram will be ordered to assess LV/RV function and any structural abnormalities. Once these testing have been performed amd reviewed further reccomendations will be made. For now, I do reccomend that the patient goes to the nearest ED if  symptoms recur.  3.  She is hypertensive in the office today I am going to add hydrochlorothiazide 12.5 mg daily to her already taking amlodipine 10 mg daily.  I encouraged the patient to decrease salt intake.  She will take her blood pressure daily and bring this at her next visit.  4.Obesity-the patient understands the need to lose weight with diet and exercise. We have discussed specific strategies for this.  The patient is in agreement with the above plan. The patient left the office in stable condition.  The patient will follow up in 3 months or sooner if needed  Medication Adjustments/Labs and Tests Ordered: Current medicines are reviewed at length with the patient today.  Concerns regarding medicines are outlined above.  Orders Placed This Encounter  Procedures  . MYOCARDIAL PERFUSION IMAGING  . LONG TERM MONITOR (3-14 DAYS)  . EKG 12-Lead  . ECHOCARDIOGRAM COMPLETE   Meds ordered this encounter  Medications  . hydrochlorothiazide (MICROZIDE) 12.5 MG capsule    Sig: Take 1 capsule (12.5 mg total) by mouth daily.    Dispense:  90 capsule    Refill:  1    Patient Instructions  Medication Instructions:  Your physician has recommended you make the following change in your medication:   START: Hydrochlorothiazide 12.5 mg Take 1 tab daily  *If you need a refill on your cardiac medications before your next appointment, please call your pharmacy*  Lab Work: None If you have labs (blood work) drawn today and your tests are completely normal, you will receive your results only by: Marland Kitchen MyChart Message (if you have  MyChart) OR . A paper copy in the mail If you have any lab test that is abnormal or we need to change your treatment, we will call you to review the results.  Testing/Procedures: Your physician has requested that you have en exercise stress myoview. For further information please visit [email or web address] Please follow instruction sheet, as given.  Your physician has requested that you have an echocardiogram. Echocardiography is a painless test that uses sound waves to create images of your heart. It provides  your doctor with information about the size and shape of your heart and how well your heart's chambers and valves are working. This procedure takes approximately one hour. There are no restrictions for this procedure.  A zio monitor was ordered today. It will remain on for 7 days. You will then return monitor and event diary in provided box. It takes 1-2 weeks for report to be downloaded and returned to [place]. We will call you with the results. If monitor falls off or has orange flashing light, please call Zio for further instructions.    Follow-Up: At [facility], you and your health needs are our priority.  As part of our continuing mission to provide you with exceptional heart care, we have created designated Provider Care Teams.  These Care Teams include your primary Cardiologist (physician) and Advanced Practice Providers (APPs -  Physician Assistants and Nurse Practitioners) who all work together to provide you with the care you need, when you need it.  Your next appointment:   3 month(s)  The format for your next appointment:   In Person  Provider:   Thomasene Ripple, DO  Other Instructions      Adopting a Healthy Lifestyle.  Know what a healthy weight is for you (roughly BMI <25) and aim to maintain this   Aim for 7+ servings of fruits and vegetables daily   65-80+ fluid ounces of water or unsweet tea for healthy kidneys   Limit to max 1 drink of alcohol per day; avoid  smoking/tobacco   Limit animal fats in diet for cholesterol and heart health - choose grass fed whenever available   Avoid highly processed foods, and foods high in saturated/trans fats   Aim for low stress - take time to unwind and care for your mental health   Aim for 150 min of moderate intensity exercise weekly for heart health, and weights twice weekly for bone health   Aim for 7-9 hours of sleep daily   When it comes to diets, agreement about the perfect plan isnt easy to find, even among the experts. Experts at the [facility] [employer] developed an idea known as the Healthy Eating Plate. Just imagine a plate divided into logical, healthy portions.   The emphasis is on diet quality:   Load up on vegetables and fruits - one-half of your plate: Aim for color and variety, and remember that potatoes dont count.   Go for whole grains - one-quarter of your plate: Whole wheat, barley, wheat berries, quinoa, oats, brown rice, and foods made with them. If you want pasta, go with whole wheat pasta.   Protein power - one-quarter of your plate: Fish, chicken, beans, and nuts are all healthy, versatile protein sources. Limit red meat.   The diet, however, does go beyond the plate, offering a few other suggestions.   Use healthy plant oils, such as olive, canola, soy, corn, sunflower and peanut. Check the labels, and avoid partially hydrogenated oil, which have unhealthy trans fats.   If youre thirsty, drink water. Coffee and tea are good in moderation, but skip sugary drinks and limit milk and dairy products to one or two daily servings.   The type of carbohydrate in the diet is more important than the amount. Some sources of carbohydrates, such as vegetables, fruits, whole grains, and beans-are healthier than others.   Finally, stay active  Signed, Thomasene Ripple, DO  08/02/2019 9:38 AM    [facility]

## 2019-08-04 ENCOUNTER — Ambulatory Visit (HOSPITAL_COMMUNITY)
Admission: RE | Admit: 2019-08-04 | Discharge: 2019-08-04 | Disposition: A | Discharge status: Home / Self Care | Payer: 59 | Source: Ambulatory Visit | Attending: Internal Medicine | Admitting: Internal Medicine

## 2019-08-04 ENCOUNTER — Other Ambulatory Visit: Payer: Self-pay

## 2019-08-04 DIAGNOSIS — R072 Precordial pain: Secondary | ICD-10-CM | POA: Insufficient documentation

## 2019-08-04 MED ORDER — REGADENOSON 0.4 MG/5ML IV SOLN
0.4000 mg | Freq: Once | INTRAVENOUS | Status: AC
  Administered 2019-08-04: 0.4 mg via INTRAVENOUS

## 2019-08-04 MED ORDER — TECHNETIUM TC 99M TETROFOSMIN IV KIT
29.8000 | PACK | Freq: Once | INTRAVENOUS | Status: AC | PRN
  Administered 2019-08-04: 29.8 via INTRAVENOUS
  Filled 2019-08-04: qty 30

## 2019-08-05 ENCOUNTER — Other Ambulatory Visit: Payer: Self-pay | Admitting: *Deleted

## 2019-08-05 ENCOUNTER — Ambulatory Visit (HOSPITAL_COMMUNITY)
Admission: RE | Admit: 2019-08-05 | Discharge: 2019-08-05 | Disposition: A | Discharge status: Home / Self Care | Payer: 59 | Source: Ambulatory Visit | Attending: Internal Medicine | Admitting: Internal Medicine

## 2019-08-05 LAB — MYOCARDIAL PERFUSION IMAGING
LV dias vol: 144 mL (ref 46–106)
LV sys vol: 80 mL
Peak HR: 96 {beats}/min
Rest HR: 68 {beats}/min
SDS: 3
SRS: 1
SSS: 4
TID: 1.02

## 2019-08-05 MED ORDER — LISINOPRIL 2.5 MG PO TABS: 2.5000 mg | ORAL_TABLET | Freq: Every day | ORAL | 1 refills | Status: DC

## 2019-08-05 MED ORDER — TECHNETIUM TC 99M TETROFOSMIN IV KIT
30.7000 | PACK | Freq: Once | INTRAVENOUS | Status: AC | PRN
  Administered 2019-08-05: 08:00:00 30.7 via INTRAVENOUS

## 2019-08-05 MED ORDER — CARVEDILOL 3.125 MG PO TABS: 3.1250 mg | ORAL_TABLET | Freq: Two times a day (BID) | ORAL | 1 refills | Status: DC

## 2019-08-05 MED FILL — LISINOPRIL 2.5 MG TABLET: 2.5 | 90 days supply | Qty: 90 | Fill #0

## 2019-08-05 MED FILL — CARVEDILOL 3.125 MG TABLET: 3.125 | 90 days supply | Qty: 180 | Fill #0

## 2019-08-08 ENCOUNTER — Ambulatory Visit (INDEPENDENT_AMBULATORY_CARE_PROVIDER_SITE_OTHER): Payer: 59

## 2019-08-08 ENCOUNTER — Other Ambulatory Visit: Payer: Self-pay

## 2019-08-08 DIAGNOSIS — R002 Palpitations: Secondary | ICD-10-CM

## 2019-08-08 DIAGNOSIS — R072 Precordial pain: Secondary | ICD-10-CM | POA: Diagnosis not present

## 2019-08-08 DIAGNOSIS — I10 Essential (primary) hypertension: Secondary | ICD-10-CM

## 2019-08-08 DIAGNOSIS — R9431 Abnormal electrocardiogram [ECG] [EKG]: Secondary | ICD-10-CM | POA: Diagnosis not present

## 2019-08-08 NOTE — Progress Notes (Signed)
Complete echocardiogram has been performed.  Jimmy Gionni Freese RDCS, RVT 

## 2019-08-17 DIAGNOSIS — R002 Palpitations: Secondary | ICD-10-CM | POA: Diagnosis not present

## 2019-08-26 ENCOUNTER — Encounter: Payer: Self-pay | Admitting: Cardiology

## 2019-08-26 ENCOUNTER — Ambulatory Visit (INDEPENDENT_AMBULATORY_CARE_PROVIDER_SITE_OTHER): Payer: 59 | Admitting: Cardiology

## 2019-08-26 ENCOUNTER — Other Ambulatory Visit: Payer: Self-pay

## 2019-08-26 VITALS — BP 130/100 | HR 76 | Ht 64.0 in | Wt 225.0 lb

## 2019-08-26 DIAGNOSIS — I10 Essential (primary) hypertension: Secondary | ICD-10-CM

## 2019-08-26 DIAGNOSIS — I472 Ventricular tachycardia: Secondary | ICD-10-CM | POA: Diagnosis not present

## 2019-08-26 DIAGNOSIS — I493 Ventricular premature depolarization: Secondary | ICD-10-CM

## 2019-08-26 DIAGNOSIS — E669 Obesity, unspecified: Secondary | ICD-10-CM

## 2019-08-26 DIAGNOSIS — I4729 Other ventricular tachycardia: Secondary | ICD-10-CM

## 2019-08-26 MED ORDER — CARVEDILOL 3.125 MG PO TABS: 3.1250 mg | ORAL_TABLET | Freq: Two times a day (BID) | ORAL | 1 refills | Status: DC

## 2019-08-26 NOTE — Progress Notes (Signed)
Cardiology Office Note:    Date:  08/27/2019   ID:  Molly Summers, DOB 02/26/1976, MRN [id number]  PCP:  Audie Pinto, FNP  Cardiologist:  Thomasene Ripple, DO  Electrophysiologist:  None   Referring MD: Audie Pinto, FNP   Chief Complaint  Patient presents with  . Follow-up    History of Present Illness:    Molly Summers is a 44 y.o. female with a hx of hypertension, obesity, depression and history of Covid 19 infection presents today for follow-up visit.  Patient was initially seen on August 02, 2019 at that time she reported chest pain, palpitations as well as shortness of breath.  At the conclusion of her visit in place to monitor the patient as well as send her for nuclear stress test and an echocardiogram.  The patient was also hypertensive that day.  In the interim her nuclear was normal with no evidence of ischemia or infarction.  The LVEF was noted to be low but however on echocardiogram visually her EF was normal.  I transiently started the patient on Lisinopril  In addition to her current regimen which includes Amlodipine 10mg  and HCTZ 12.5 mg daily.  She is her today for a follow up visit. She tells me that since the increase in her lexapro her chest symptoms have resolved. She does still have intermittent palpitations.  Past Medical History:  Diagnosis Date  . Attention deficit hyperactivity disorder (ADHD), combined type 04/06/2017  . COVID-19 virus infection 12/30/2018  . Depression   . Heart palpitations 07/28/2019  . High blood cholesterol level   . Hypertension   . Hypertension, essential, benign 01/05/2019  . Malaise and fatigue 07/28/2019  . Obesity with body mass index (BMI) of 30.0 to 39.9 03/27/2017  . Severe episode of recurrent major depressive disorder, without psychotic features (HCC) 03/27/2017    Past Surgical History:  Procedure Laterality Date  . LAPAROSCOPIC OVARIAN Left     Current Medications: Current Meds  Medication Sig  .  amLODipine (NORVASC) 10 MG tablet Take 10 mg by mouth daily.  03/29/2017 ascorbic acid (VITAMIN C) 1000 MG tablet Take 1,000 mg by mouth daily.  Marland Kitchen b complex vitamins capsule Take by mouth.  . Biotin 1 MG CAPS Take by mouth.  . Cholecalciferol (VITAMIN D3) 25 MCG (1000 UT) CAPS Take 1,000 Units by mouth daily.  Marland Kitchen escitalopram (LEXAPRO) 20 MG tablet Take 20 mg by mouth daily.  . famotidine (PEPCID) 20 MG tablet Take 20 mg by mouth daily as needed.   . hydrochlorothiazide (MICROZIDE) 12.5 MG capsule Take 1 capsule (12.5 mg total) by mouth daily.  Marland Kitchen lisinopril (ZESTRIL) 2.5 MG tablet Take 1 tablet (2.5 mg total) by mouth daily.  . Multiple Minerals (CALCIUM-MAGNESIUM-ZINC) TABS Take by mouth.  . Multiple Vitamins-Minerals (MULTIVITAMIN WITH MINERALS) tablet Take by mouth.  . zinc gluconate 50 MG tablet Take 50 mg by mouth daily.     Allergies:   Patient has no known allergies.   Social History   Socioeconomic History  . Marital status: Married    Spouse name: Not on file  . Number of children: Not on file  . Years of education: Not on file  . Highest education level: Not on file  Occupational History  . Not on file  Tobacco Use  . Smoking status: Former Smoker    Years: 24.00    Types: Cigarettes    Quit date: 07/06/2018    Years since quitting: 1.1  . Smokeless  tobacco: Never Used  . Tobacco comment: intermittent tobacco use since 44 yo.  Substance and Sexual Activity  . Alcohol use: Not Currently  . Drug use: Never  . Sexual activity: Not on file  Other Topics Concern  . Not on file  Social History Narrative  . Not on file   Social Determinants of Health   Financial Resource Strain:   . Difficulty of Paying Living Expenses: Not on file  Food Insecurity:   . Worried About Programme researcher, broadcasting/film/video in the Last Year: Not on file  . Ran Out of Food in the Last Year: Not on file  Transportation Needs:   . Lack of Transportation (Medical): Not on file  . Lack of Transportation  (Non-Medical): Not on file  Physical Activity:   . Days of Exercise per Week: Not on file  . Minutes of Exercise per Session: Not on file  Stress:   . Feeling of Stress : Not on file  Social Connections:   . Frequency of Communication with Friends and Family: Not on file  . Frequency of Social Gatherings with Friends and Family: Not on file  . Attends Religious Services: Not on file  . Active Member of Clubs or Organizations: Not on file  . Attends Banker Meetings: Not on file  . Marital Status: Not on file     Family History: The patient's family history includes Hyperlipidemia in her father and mother; Hypertension in her father and mother. There is no history of Cancer or Diabetes.  ROS:   Review of Systems  Constitution: Negative for decreased appetite, fever and weight gain.  HENT: Negative for congestion, ear discharge, hoarse voice and sore throat.   Eyes: Negative for discharge, redness, vision loss in right eye and visual halos.  Cardiovascular: She reports palpitations.  Negative for chest pain, dyspnea on exertion, leg swelling, orthopnea a.  Respiratory: Negative for cough, hemoptysis, shortness of breath and snoring.   Endocrine: Negative for heat intolerance and polyphagia.  Hematologic/Lymphatic: Negative for bleeding problem. Does not bruise/bleed easily.  Skin: Negative for flushing, nail changes, rash and suspicious lesions.  Musculoskeletal: Negative for arthritis, joint pain, muscle cramps, myalgias, neck pain and stiffness.  Gastrointestinal: Negative for abdominal pain, bowel incontinence, diarrhea and excessive appetite.  Genitourinary: Negative for decreased libido, genital sores and incomplete emptying.  Neurological: Negative for brief paralysis, focal weakness, headaches and loss of balance.  Psychiatric/Behavioral: Negative for altered mental status, depression and suicidal ideas.  Allergic/Immunologic: Negative for HIV exposure and  persistent infections.    EKGs/Labs/Other Studies Reviewed:    The following studies were reviewed today:   EKG:  None today   Zio Monitor: The patient wore the monitor for 7 days 0 hours starting August 02, 2019. Indication: Palpitations  The minimum heart rate was 54 bpm, maximum heart rate was 182 bpm, and average heart rate was 87 bpm. Predominant underlying rhythm was Sinus Rhythm.  1 run of Ventricular Tachycardia occurred lasting 4 beats with a maximum rate of 182 bpm (average 134 bpm).   Premature atrial complexes were rare less than 1%. Premature Ventricular complexes were rare less than 1%.  No pauses, No AV block and no atrial fibrillation present.  9 patient triggered events: 7 associated with premature ventricular complex, 2 associated with premature atrial complexes. 4 diary events all associated with premature ventricular complexes.    Conclusion: This study is remarkable for the following:  1.  Nonsustained ventricular tachycardia (one 4 beat run)               2.  Symptomatic premature ventricular complexes.  Pharmacological stress test:  Nuclear stress EF: 44%. The left ventricular ejection fraction is moderately decreased (30-44%).  There was no ST segment deviation noted during stress.  The study is normal. There is no evidence of ischemia and no evidence of previous infarction.  This is a low risk study.   TTE Impressions IMPRESSIONS 08/08/2019 1. Left ventricular ejection fraction, by visual estimation, is 60 to  65%. The left ventricle has normal function. There is mildly increased  left ventricular hypertrophy.  2. Left ventricular diastolic parameters are consistent with Grade I  diastolic dysfunction (impaired relaxation).  3. The left ventricle has no regional wall motion abnormalities.  4. Global right ventricle has normal systolic function.The right  ventricular size is normal. No increase in right ventricular wall    thickness. 5. Left atrial size was mildly dilated.   Recent Labs: 12/30/2018: B Natriuretic Peptide 73.4 01/02/2019: Magnesium 2.3; TSH 0.398 01/03/2019: ALT 68; BUN 23; Creatinine, Ser 0.85; Hemoglobin 13.3; Platelets 459; Potassium 3.8; Sodium 141  Recent Lipid Panel No results found for: CHOL, TRIG, HDL, CHOLHDL, VLDL, LDLCALC, LDLDIRECT  Physical Exam:    VS:  BP (!) 130/100 (BP Location: Left Arm, Patient Position: Sitting, Cuff Size: Large)   Pulse 76   Ht 5\' 4"  (1.626 m)   Wt 225 lb (102.1 kg)   SpO2 98%   BMI 38.62 kg/m     Wt Readings from Last 3 Encounters:  08/26/19 225 lb (102.1 kg)  08/04/19 229 lb (103.9 kg)  08/02/19 229 lb (103.9 kg)     GEN: Well nourished, well developed in no acute distress HEENT: Normal NECK: No JVD; No carotid bruits LYMPHATICS: No lymphadenopathy CARDIAC: S1S2 noted,RRR, no murmurs, rubs, gallops RESPIRATORY:  Clear to auscultation without rales, wheezing or rhonchi  ABDOMEN: Soft, non-tender, non-distended, +bowel sounds, no guarding. EXTREMITIES: No edema, No cyanosis, no clubbing MUSCULOSKELETAL:  No deformity  SKIN: Warm and dry NEUROLOGIC:  Alert and oriented x 3, non-focal PSYCHIATRIC:  Normal affect, good insight  ASSESSMENT:    1. NSVT (nonsustained ventricular tachycardia) (HCC)   2. Symptomatic PVCs   3. Hypertension, essential, benign   4. Obesity (BMI 30-39.9)    PLAN:     Still hypertensive, and in the setting of her symptomatic PVC, I will start her on coreg 3.125mg  BID. For now we will also continue the patient on her other antihypertensives. Should we need to optimize her medication, I plan to stop the lisinopril prior to increasing her beta blocker.  She will take her blood pressure daily.  Hoping that the addition of coreg will help her symptomatic pVC and NSVT.  The patient understands the need to lose weight with diet and exercise. We have discussed specific strategies for this.  The patient is in  agreement with the above plan. The patient left the office in stable condition.  The patient will follow up in 1 month   Medication Adjustments/Labs and Tests Ordered: Current medicines are reviewed at length with the patient today.  Concerns regarding medicines are outlined above.  No orders of the defined types were placed in this encounter.  Meds ordered this encounter  Medications  . carvedilol (COREG) 3.125 MG tablet    Sig: Take 1 tablet (3.125 mg total) by mouth 2 (two) times daily.    Dispense:  180 tablet    Refill:  1    Patient Instructions  Medication Instructions:  Your physician has recommended you make the following change in your medication:   RESTART: Coreg 3.125 mg Take 1 tab twice daily  *If you need a refill on your cardiac medications before your next appointment, please call your pharmacy*  Lab Work: None If you have labs (blood work) drawn today and your tests are completely normal, you will receive your results only by: Marland Kitchen MyChart Message (if you have MyChart) OR . A paper copy in the mail If you have any lab test that is abnormal or we need to change your treatment, we will call you to review the results.  Testing/Procedures: nOne  Follow-Up: At [facility], you and your health needs are our priority.  As part of our continuing mission to provide you with exceptional heart care, we have created designated Provider Care Teams.  These Care Teams include your primary Cardiologist (physician) and Advanced Practice Providers (APPs -  Physician Assistants and Nurse Practitioners) who all work together to provide you with the care you need, when you need it.  Your next appointment:   1 month(s)  The format for your next appointment:   In Person  Provider:   Berniece Salines, DO  Other Instructions      Adopting a Healthy Lifestyle.  Know what a healthy weight is for you (roughly BMI <25) and aim to maintain this   Aim for 7+ servings of fruits and  vegetables daily   65-80+ fluid ounces of water or unsweet tea for healthy kidneys   Limit to max 1 drink of alcohol per day; avoid smoking/tobacco   Limit animal fats in diet for cholesterol and heart health - choose grass fed whenever available   Avoid highly processed foods, and foods high in saturated/trans fats   Aim for low stress - take time to unwind and care for your mental health   Aim for 150 min of moderate intensity exercise weekly for heart health, and weights twice weekly for bone health   Aim for 7-9 hours of sleep daily   When it comes to diets, agreement about the perfect plan isnt easy to find, even among the experts. Experts at the [employer] developed an idea known as the Healthy Eating Plate. Just imagine a plate divided into logical, healthy portions.   The emphasis is on diet quality:   Load up on vegetables and fruits - one-half of your plate: Aim for color and variety, and remember that potatoes dont count.   Go for whole grains - one-quarter of your plate: Whole wheat, barley, wheat berries, quinoa, oats, brown rice, and foods made with them. If you want pasta, go with whole wheat pasta.   Protein power - one-quarter of your plate: Fish, chicken, beans, and nuts are all healthy, versatile protein sources. Limit red meat.   The diet, however, does go beyond the plate, offering a few other suggestions.   Use healthy plant oils, such as olive, canola, soy, corn, sunflower and peanut. Check the labels, and avoid partially hydrogenated oil, which have unhealthy trans fats.   If youre thirsty, drink water. Coffee and tea are good in moderation, but skip sugary drinks and limit milk and dairy products to one or two daily servings.   The type of carbohydrate in the diet is more important than the amount. Some sources of carbohydrates, such as vegetables, fruits, whole grains, and  beans-are healthier than others.   Finally, stay  active  Signed, Thomasene Ripple, DO  08/27/2019 12:06 PM    [facility]

## 2019-08-26 NOTE — Patient Instructions (Signed)
Medication Instructions:  Your physician has recommended you make the following change in your medication:   RESTART: Coreg 3.125 mg Take 1 tab twice daily  *If you need a refill on your cardiac medications before your next appointment, please call your pharmacy*  Lab Work: None If you have labs (blood work) drawn today and your tests are completely normal, you will receive your results only by: Marland Kitchen MyChart Message (if you have MyChart) OR . A paper copy in the mail If you have any lab test that is abnormal or we need to change your treatment, we will call you to review the results.  Testing/Procedures: nOne  Follow-Up: At [facility], you and your health needs are our priority.  As part of our continuing mission to provide you with exceptional heart care, we have created designated Provider Care Teams.  These Care Teams include your primary Cardiologist (physician) and Advanced Practice Providers (APPs -  Physician Assistants and Nurse Practitioners) who all work together to provide you with the care you need, when you need it.  Your next appointment:   1 month(s)  The format for your next appointment:   In Person  Provider:   Thomasene Ripple, DO  Other Instructions

## 2019-09-26 ENCOUNTER — Other Ambulatory Visit: Payer: Self-pay | Admitting: Cardiology

## 2019-09-26 ENCOUNTER — Encounter: Payer: Self-pay | Admitting: Cardiology

## 2019-09-26 ENCOUNTER — Ambulatory Visit (INDEPENDENT_AMBULATORY_CARE_PROVIDER_SITE_OTHER): Payer: 59 | Admitting: Cardiology

## 2019-09-26 ENCOUNTER — Other Ambulatory Visit: Payer: Self-pay

## 2019-09-26 VITALS — BP 130/88 | HR 68 | Ht 64.0 in | Wt 230.0 lb

## 2019-09-26 DIAGNOSIS — I4729 Other ventricular tachycardia: Secondary | ICD-10-CM

## 2019-09-26 DIAGNOSIS — E669 Obesity, unspecified: Secondary | ICD-10-CM

## 2019-09-26 DIAGNOSIS — I493 Ventricular premature depolarization: Secondary | ICD-10-CM

## 2019-09-26 DIAGNOSIS — I472 Ventricular tachycardia: Secondary | ICD-10-CM | POA: Diagnosis not present

## 2019-09-26 DIAGNOSIS — I10 Essential (primary) hypertension: Secondary | ICD-10-CM | POA: Diagnosis not present

## 2019-09-26 DIAGNOSIS — Z8616 Personal history of COVID-19: Secondary | ICD-10-CM

## 2019-09-26 MED ORDER — HYDROCHLOROTHIAZIDE 12.5 MG PO CAPS: 12.5000 mg | ORAL_CAPSULE | Freq: Every day | ORAL | 3 refills | Status: DC

## 2019-09-26 MED ORDER — AMLODIPINE BESYLATE 10 MG PO TABS: 10.0000 mg | ORAL_TABLET | Freq: Every day | ORAL | 3 refills | Status: DC

## 2019-09-26 MED ORDER — LISINOPRIL 2.5 MG PO TABS: 2.5000 mg | ORAL_TABLET | Freq: Every day | ORAL | 3 refills | Status: DC

## 2019-09-26 MED ORDER — CARVEDILOL 3.125 MG PO TABS: 3.1250 mg | ORAL_TABLET | Freq: Two times a day (BID) | ORAL | 3 refills | Status: DC

## 2019-09-26 NOTE — Progress Notes (Signed)
Cardiology Office Note:    Date:  09/26/2019   ID:  Molly Summers, DOB 12/22/1975, MRN [id number]  PCP:  Audie Pinto, FNP  Cardiologist:  Thomasene Ripple, DO  Electrophysiologist:  None   Referring MD: Audie Pinto, FNP   Patient is here for follow-up visit.  History of Present Illness:    Molly Summers is a 44 y.o. female with a hx of hypertension, hyperlipidemia, recent Covid infection, NSVT presents today for follow-up visit.  I did see the patient on August 25, 2018 at that time we had discussed her echocardiogram results which was normal as well as her nuclear results which did not show any evidence of ischemia.  Her monitor did show nonsustained ventricular tachycardia.  She was hypertensive at her visit.  At that time given her NSVT and symptomatic PVCs I started patient on Coreg 3.125 mg twice a day in addition to her lisinopril 2.5 mg daily, hydrochlorothiazide 12.5 mg daily and amlodipine 10 mg daily.  She is here today for follow-up visit.  She offers no complaints at this time.   Past Medical History:  Diagnosis Date  . Annual physical exam 03/27/2017  . Attention deficit hyperactivity disorder (ADHD), combined type 04/06/2017  . COVID-19 virus infection 12/30/2018  . Depression   . Heart palpitations 07/28/2019  . High blood cholesterol level   . Hyperlipidemia 10/27/2011   01/2012 Total  236 LDL  149 HDL  69 Trig  88 07/5398 Total  236 LDL  149 HDL  69 Trig  88  . Hypertension   . Hypertension, essential, benign 01/05/2019  . Malaise and fatigue 07/28/2019  . Obesity with body mass index (BMI) of 30.0 to 39.9 03/27/2017  . Severe episode of recurrent major depressive disorder, without psychotic features (HCC) 03/27/2017    Past Surgical History:  Procedure Laterality Date  . LAPAROSCOPIC OVARIAN Left     Current Medications: Current Meds  Medication Sig  . amLODipine (NORVASC) 10 MG tablet Take 10 mg by mouth daily.  Marland Kitchen ascorbic acid (VITAMIN C) 1000  MG tablet Take 1,000 mg by mouth daily.  Marland Kitchen b complex vitamins capsule Take by mouth.  . Biotin 1 MG CAPS Take by mouth.  . carvedilol (COREG) 3.125 MG tablet Take 1 tablet (3.125 mg total) by mouth 2 (two) times daily.  . Cholecalciferol (VITAMIN D3) 25 MCG (1000 UT) CAPS Take 1,000 Units by mouth daily.  Marland Kitchen escitalopram (LEXAPRO) 20 MG tablet Take 20 mg by mouth daily.  . famotidine (PEPCID) 20 MG tablet Take 20 mg by mouth daily as needed.   . hydrochlorothiazide (MICROZIDE) 12.5 MG capsule Take 1 capsule (12.5 mg total) by mouth daily.  Marland Kitchen lisinopril (ZESTRIL) 2.5 MG tablet Take 1 tablet (2.5 mg total) by mouth daily.  Marland Kitchen loratadine (CLARITIN) 10 MG tablet Take 10 mg by mouth daily.  . Multiple Minerals (CALCIUM-MAGNESIUM-ZINC) TABS Take by mouth.  . Multiple Vitamins-Minerals (MULTIVITAMIN WITH MINERALS) tablet Take by mouth.  . zinc gluconate 50 MG tablet Take 50 mg by mouth daily.     Allergies:   Patient has no known allergies.   Social History   Socioeconomic History  . Marital status: Married    Spouse name: Not on file  . Number of children: Not on file  . Years of education: Not on file  . Highest education level: Not on file  Occupational History  . Not on file  Tobacco Use  . Smoking status: Former Smoker  Years: 24.00    Types: Cigarettes    Quit date: 07/06/2018    Years since quitting: 1.2  . Smokeless tobacco: Never Used  . Tobacco comment: intermittent tobacco use since 44 yo.  Substance and Sexual Activity  . Alcohol use: Not Currently  . Drug use: Never  . Sexual activity: Not on file  Other Topics Concern  . Not on file  Social History Narrative  . Not on file   Social Determinants of Health   Financial Resource Strain:   . Difficulty of Paying Living Expenses:   Food Insecurity:   . Worried About Programme researcher, broadcasting/film/videounning Out of Food in the Last Year:   . Baristaan Out of Food in the Last Year:   Transportation Needs:   . Freight forwarderLack of Transportation (Medical):   Marland Kitchen. Lack of  Transportation (Non-Medical):   Physical Activity:   . Days of Exercise per Week:   . Minutes of Exercise per Session:   Stress:   . Feeling of Stress :   Social Connections:   . Frequency of Communication with Friends and Family:   . Frequency of Social Gatherings with Friends and Family:   . Attends Religious Services:   . Active Member of Clubs or Organizations:   . Attends BankerClub or Organization Meetings:   Marland Kitchen. Marital Status:      Family History: The patient's family history includes Hyperlipidemia in her father and mother; Hypertension in her father and mother. There is no history of Cancer or Diabetes.  ROS:   Review of Systems  Constitution: Negative for decreased appetite, fever and weight gain.  HENT: Negative for congestion, ear discharge, hoarse voice and sore throat.   Eyes: Negative for discharge, redness, vision loss in right eye and visual halos.  Cardiovascular: Negative for chest pain, dyspnea on exertion, leg swelling, orthopnea and palpitations.  Respiratory: Negative for cough, hemoptysis, shortness of breath and snoring.   Endocrine: Negative for heat intolerance and polyphagia.  Hematologic/Lymphatic: Negative for bleeding problem. Does not bruise/bleed easily.  Skin: Negative for flushing, nail changes, rash and suspicious lesions.  Musculoskeletal: Negative for arthritis, joint pain, muscle cramps, myalgias, neck pain and stiffness.  Gastrointestinal: Negative for abdominal pain, bowel incontinence, diarrhea and excessive appetite.  Genitourinary: Negative for decreased libido, genital sores and incomplete emptying.  Neurological: Negative for brief paralysis, focal weakness, headaches and loss of balance.  Psychiatric/Behavioral: Negative for altered mental status, depression and suicidal ideas.  Allergic/Immunologic: Negative for HIV exposure and persistent infections.    EKGs/Labs/Other Studies Reviewed:    The following studies were reviewed  today:   EKG: None today  Zio Monitor: The patient wore the monitor for 7 days 0 hours starting August 02, 2019. Indication: Palpitations  The minimum heart rate was 54 bpm, maximum heart rate was 182 bpm, and average heart rate was 87 bpm. Predominant underlying rhythm was Sinus Rhythm.  1 run of Ventricular Tachycardia occurred lasting 4 beats with a maximum rate of 182 bpm (average 134 bpm).   Premature atrial complexes were rare less than 1%. Premature Ventricular complexes were rare less than 1%.  No pauses, No AV block and no atrial fibrillation present.  9 patient triggered events: 7 associated with premature ventricular complex, 2 associated with premature atrial complexes. 4 diary events all associated with premature ventricular complexes.   Conclusion: This study is remarkable for the following: 1. Nonsustained ventricular tachycardia (one 4 beat run) 2. Symptomatic premature ventricular complexes.  Pharmacological stress test:  Nuclear stress EF:  44%. The left ventricular ejection fraction is moderately decreased (30-44%).  There was no ST segment deviation noted during stress.  The study is normal. There is no evidence of ischemia and no evidence of previous infarction.  This is a low risk study.  TTE Impressions IMPRESSIONS 08/08/2019 1. Left ventricular ejection fraction, by visual estimation, is 60 to  65%. The left ventricle has normal function. There is mildly increased  left ventricular hypertrophy.  2. Left ventricular diastolic parameters are consistent with Grade I  diastolic dysfunction (impaired relaxation).  3. The left ventricle has no regional wall motion abnormalities.  4. Global right ventricle has normal systolic function.The right  ventricular size is normal. No increase in right ventricular wall  thickness. 5. Left atrial size was mildly dilated.   Recent Labs: 12/30/2018: B Natriuretic Peptide  73.4 01/02/2019: Magnesium 2.3; TSH 0.398 01/03/2019: ALT 68; BUN 23; Creatinine, Ser 0.85; Hemoglobin 13.3; Platelets 459; Potassium 3.8; Sodium 141  Recent Lipid Panel No results found for: CHOL, TRIG, HDL, CHOLHDL, VLDL, LDLCALC, LDLDIRECT  Physical Exam:    VS:  BP 130/88 (BP Location: Right Arm, Patient Position: Sitting, Cuff Size: Large)   Pulse 68   Ht 5\' 4"  (1.626 m)   Wt 230 lb (104.3 kg)   SpO2 96%   BMI 39.48 kg/m     Wt Readings from Last 3 Encounters:  09/26/19 230 lb (104.3 kg)  08/26/19 225 lb (102.1 kg)  08/04/19 229 lb (103.9 kg)     GEN: Well nourished, well developed in no acute distress HEENT: Normal NECK: No JVD; No carotid bruits LYMPHATICS: No lymphadenopathy CARDIAC: S1S2 noted,RRR, no murmurs, rubs, gallops RESPIRATORY:  Clear to auscultation without rales, wheezing or rhonchi  ABDOMEN: Soft, non-tender, non-distended, +bowel sounds, no guarding. EXTREMITIES: No edema, No cyanosis, no clubbing MUSCULOSKELETAL:  No deformity  SKIN: Warm and dry NEUROLOGIC:  Alert and oriented x 3, non-focal PSYCHIATRIC:  Normal affect, good insight  ASSESSMENT:    1. Hypertension, essential, benign   2. Obesity with body mass index (BMI) of 30.0 to 39.9   3. Symptomatic PVCs   4. NSVT (nonsustained ventricular tachycardia) ([place])   5. History of COVID-19    PLAN:     1.  Hypertension -her blood pressure is acceptable at this time.  Going to continue the patient had currently hypertensive regimen which includes amlodipine 10 mg daily, carvedilol 3.125 mg twice a day, hydrochlorothiazide 12.5 mg daily and lisinopril 2.5 mg daily.  2.  Symptomatic PVCs/NSVT-since starting beta-blocker patient reports that her symptoms have improved greatly.  3.  Obesity-the patient understands the need to lose weight with diet and exercise. We have discussed specific strategies for this.  4.  History of COVID-19 infection-patient has recovered and is back to her baseline.  The  patient is in agreement with the above plan. The patient left the office in stable condition.  The patient will follow up in   Medication Adjustments/Labs and Tests Ordered: Current medicines are reviewed at length with the patient today.  Concerns regarding medicines are outlined above.  No orders of the defined types were placed in this encounter.  No orders of the defined types were placed in this encounter.   Patient Instructions  Medication Instructions:  *If you need a refill on your cardiac medications before your next appointment, please call your pharmacy*  Lab Work: If you have labs (blood work) drawn today and your tests are completely normal, you will receive your results only by: Marland Kitchen  MyChart Message (if you have MyChart) OR . A paper copy in the mail If you have any lab test that is abnormal or we need to change your treatment, we will call you to review the results.  Follow-Up: At [facility], you and your health needs are our priority.  As part of our continuing mission to provide you with exceptional heart care, we have created designated Provider Care Teams.  These Care Teams include your primary Cardiologist (physician) and Advanced Practice Providers (APPs -  Physician Assistants and Nurse Practitioners) who all work together to provide you with the care you need, when you need it.  We recommend signing up for the patient portal called "MyChart".  Sign up information is provided on this After Visit Summary.  MyChart is used to connect with patients for Virtual Visits (Telemedicine).  Patients are able to view lab/test results, encounter notes, upcoming appointments, etc.  Non-urgent messages can be sent to your provider as well.   To learn more about what you can do with MyChart, go to [email or web address].    Your next appointment:   6 month(s)  The format for your next appointment:   In Person  Provider:   You will see Lindalou Soltis, DO.        Adopting a  Healthy Lifestyle.  Know what a healthy weight is for you (roughly BMI <25) and aim to maintain this   Aim for 7+ servings of fruits and vegetables daily   65-80+ fluid ounces of water or unsweet tea for healthy kidneys   Limit to max 1 drink of alcohol per day; avoid smoking/tobacco   Limit animal fats in diet for cholesterol and heart health - choose grass fed whenever available   Avoid highly processed foods, and foods high in saturated/trans fats   Aim for low stress - take time to unwind and care for your mental health   Aim for 150 min of moderate intensity exercise weekly for heart health, and weights twice weekly for bone health   Aim for 7-9 hours of sleep daily   When it comes to diets, agreement about the perfect plan isnt easy to find, even among the experts. Experts at the [facility] [employer] developed an idea known as the Healthy Eating Plate. Just imagine a plate divided into logical, healthy portions.   The emphasis is on diet quality:   Load up on vegetables and fruits - one-half of your plate: Aim for color and variety, and remember that potatoes dont count.   Go for whole grains - one-quarter of your plate: Whole wheat, barley, wheat berries, quinoa, oats, brown rice, and foods made with them. If you want pasta, go with whole wheat pasta.   Protein power - one-quarter of your plate: Fish, chicken, beans, and nuts are all healthy, versatile protein sources. Limit red meat.   The diet, however, does go beyond the plate, offering a few other suggestions.   Use healthy plant oils, such as olive, canola, soy, corn, sunflower and peanut. Check the labels, and avoid partially hydrogenated oil, which have unhealthy trans fats.   If youre thirsty, drink water. Coffee and tea are good in moderation, but skip sugary drinks and limit milk and dairy products to one or two daily servings.   The type of carbohydrate in the diet is more important than the  amount. Some sources of carbohydrates, such as vegetables, fruits, whole grains, and beans-are healthier than others.   Finally, stay active  Osvaldo Shipper, DO  09/26/2019 10:44 AM    [facility]

## 2019-09-26 NOTE — Patient Instructions (Signed)
Medication Instructions:   *If you need a refill on your cardiac medications before your next appointment, please call your pharmacy*  Lab Work:  If you have labs (blood work) drawn today and your tests are completely normal, you will receive your results only by: . MyChart Message (if you have MyChart) OR . A paper copy in the mail If you have any lab test that is abnormal or we need to change your treatment, we will call you to review the results.  Follow-Up: At [facility], you and your health needs are our priority.  As part of our continuing mission to provide you with exceptional heart care, we have created designated Provider Care Teams.  These Care Teams include your primary Cardiologist (physician) and Advanced Practice Providers (APPs -  Physician Assistants and Nurse Practitioners) who all work together to provide you with the care you need, when you need it.  We recommend signing up for the patient portal called "MyChart".  Sign up information is provided on this After Visit Summary.  MyChart is used to connect with patients for Virtual Visits (Telemedicine).  Patients are able to view lab/test results, encounter notes, upcoming appointments, etc.  Non-urgent messages can be sent to your provider as well.   To learn more about what you can do with MyChart, go to [email or web address].    Your next appointment:   6 months  The format for your next appointment:   In Person  Provider:    You will see Kardie Tobb, DO.     

## 2019-10-27 MED FILL — CARVEDILOL 3.125 MG TABLET: 3.125 | 90 days supply | Qty: 180 | Fill #1

## 2019-10-27 MED FILL — AMLODIPINE BESYLATE 10 MG T: 10 | 90 days supply | Qty: 90 | Fill #1

## 2019-10-27 MED FILL — ESCITALOPRAM 20 MG TABLET: 20 | 90 days supply | Qty: 90 | Fill #1

## 2019-10-27 MED FILL — LISINOPRIL 2.5 MG TABLET: 2.5 | 90 days supply | Qty: 90 | Fill #1

## 2019-10-27 MED FILL — HYDROCHLOROTHIAZIDE 12.5 MG: 12.5 | 90 days supply | Qty: 90 | Fill #1

## 2019-11-08 ENCOUNTER — Ambulatory Visit: Payer: 59 | Admitting: Cardiology

## 2020-01-23 ENCOUNTER — Other Ambulatory Visit: Payer: Self-pay

## 2020-01-23 MED ORDER — LISINOPRIL 2.5 MG PO TABS: 2.5000 mg | ORAL_TABLET | Freq: Every day | ORAL | 1 refills | Status: DC

## 2020-01-23 MED ORDER — HYDROCHLOROTHIAZIDE 12.5 MG PO CAPS: 12.5000 mg | ORAL_CAPSULE | Freq: Every day | ORAL | 1 refills | Status: DC

## 2020-01-23 MED ORDER — CARVEDILOL 3.125 MG PO TABS: 3.1250 mg | ORAL_TABLET | Freq: Two times a day (BID) | ORAL | 1 refills | Status: DC

## 2020-01-23 MED FILL — HYDROCHLOROTHIAZIDE 12.5 MG: 12.5 | 90 days supply | Qty: 90 | Fill #0

## 2020-01-23 MED FILL — LISINOPRIL 2.5 MG TABLET: 2.5 | 90 days supply | Qty: 90 | Fill #0

## 2020-01-23 MED FILL — ESCITALOPRAM 20 MG TABLET: 20 | 90 days supply | Qty: 90 | Fill #2

## 2020-01-23 MED FILL — AMLODIPINE BESYLATE 10 MG T: 10 | 90 days supply | Qty: 90 | Fill #2

## 2020-01-23 MED FILL — CARVEDILOL 3.125 MG TABLET: 3.125 | 90 days supply | Qty: 180 | Fill #0

## 2020-01-23 NOTE — Telephone Encounter (Signed)
Rx refill sent to [facility] for Hydrochlorothiazide 12.5 mg, Carvedilol 3.125 mg, and Lisinopril 2.5 mg.

## 2020-01-24 DIAGNOSIS — R002 Palpitations: Secondary | ICD-10-CM | POA: Diagnosis not present

## 2020-01-24 DIAGNOSIS — F332 Major depressive disorder, recurrent severe without psychotic features: Secondary | ICD-10-CM | POA: Diagnosis not present

## 2020-01-24 DIAGNOSIS — E782 Mixed hyperlipidemia: Secondary | ICD-10-CM | POA: Diagnosis not present

## 2020-01-24 DIAGNOSIS — R5383 Other fatigue: Secondary | ICD-10-CM | POA: Diagnosis not present

## 2020-01-24 DIAGNOSIS — R600 Localized edema: Secondary | ICD-10-CM | POA: Insufficient documentation

## 2020-01-24 DIAGNOSIS — I10 Essential (primary) hypertension: Secondary | ICD-10-CM | POA: Diagnosis not present

## 2020-01-24 DIAGNOSIS — R5381 Other malaise: Secondary | ICD-10-CM | POA: Diagnosis not present

## 2020-01-24 DIAGNOSIS — E669 Obesity, unspecified: Secondary | ICD-10-CM | POA: Diagnosis not present

## 2020-01-24 MED FILL — FUROSEMIDE 20 MG TABS: 20 | 30 days supply | Qty: 30 | Fill #0

## 2020-03-30 ENCOUNTER — Ambulatory Visit: Payer: 59 | Admitting: Cardiology

## 2020-03-30 ENCOUNTER — Encounter: Payer: Self-pay | Admitting: Cardiology

## 2020-03-30 ENCOUNTER — Other Ambulatory Visit: Payer: Self-pay | Admitting: Cardiology

## 2020-03-30 ENCOUNTER — Other Ambulatory Visit: Payer: Self-pay

## 2020-03-30 VITALS — BP 112/72 | HR 100 | Ht 64.0 in | Wt 241.0 lb

## 2020-03-30 DIAGNOSIS — I10 Essential (primary) hypertension: Secondary | ICD-10-CM | POA: Diagnosis not present

## 2020-03-30 DIAGNOSIS — E669 Obesity, unspecified: Secondary | ICD-10-CM

## 2020-03-30 DIAGNOSIS — I493 Ventricular premature depolarization: Secondary | ICD-10-CM | POA: Diagnosis not present

## 2020-03-30 DIAGNOSIS — I472 Ventricular tachycardia: Secondary | ICD-10-CM

## 2020-03-30 DIAGNOSIS — I4729 Other ventricular tachycardia: Secondary | ICD-10-CM | POA: Insufficient documentation

## 2020-03-30 MED ORDER — LISINOPRIL 5 MG PO TABS: 5.0000 mg | ORAL_TABLET | Freq: Every day | ORAL | 3 refills | Status: DC

## 2020-03-30 MED ORDER — CARVEDILOL 6.25 MG PO TABS: 6.2500 mg | ORAL_TABLET | Freq: Two times a day (BID) | ORAL | 3 refills | Status: DC

## 2020-03-30 MED FILL — CARVEDILOL 6.25 MG TABLET: 6.25 | 90 days supply | Qty: 180 | Fill #0

## 2020-03-30 NOTE — Patient Instructions (Signed)
Medication Instructions:  1) Stop Amlodipine   2) Increase Lisinopril to 5 mg daily   3) Increase Carvedilol to 6.25 mg twice a day   *If you need a refill on your cardiac medications before your next appointment, please call your pharmacy*   Lab Work: None ordered   If you have labs (blood work) drawn today and your tests are completely normal, you will receive your results only by: Marland Kitchen MyChart Message (if you have MyChart) OR . A paper copy in the mail If you have any lab test that is abnormal or we need to change your treatment, we will call you to review the results.   Testing/Procedures: None ordered    Follow-Up: At [facility], you and your health needs are our priority.  As part of our continuing mission to provide you with exceptional heart care, we have created designated Provider Care Teams.  These Care Teams include your primary Cardiologist (physician) and Advanced Practice Providers (APPs -  Physician Assistants and Nurse Practitioners) who all work together to provide you with the care you need, when you need it.  We recommend signing up for the patient portal called "MyChart".  Sign up information is provided on this After Visit Summary.  MyChart is used to connect with patients for Virtual Visits (Telemedicine).  Patients are able to view lab/test results, encounter notes, upcoming appointments, etc.  Non-urgent messages can be sent to your provider as well.   To learn more about what you can do with MyChart, go to [email or web address].    Your next appointment:   3 month(s)  The format for your next appointment:   In Person  Provider:   Thomasene Ripple, DO   Other Instructions None

## 2020-03-30 NOTE — Progress Notes (Signed)
Cardiology Office Note:    Date:  03/30/2020   ID:  Molly Summers, DOB 1975-12-24, MRN [id number]  PCP:  Audie Pinto, FNP  Cardiologist:  Thomasene Ripple, DO  Electrophysiologist:  None   Referring MD: Audie Pinto, FNP   Follow up  History of Present Illness:    Molly Summers is a 44 y.o. female with a hx of hypertension, hyperlipidemia, recent Covid infection, NSVT and symptomatic PVCs presents today for follow-up visit.  Patient tells me she has been doing well from a cardiovascular standpoint.  She notes that at times she has sensation headaches when she take her blood pressure does usually systolics in the 140s.  She also has had worsening leg edema her PCP started her on intermittent Lasix which she had taken once.  Past Medical History:  Diagnosis Date  . Annual physical exam 03/27/2017  . Attention deficit hyperactivity disorder (ADHD), combined type 04/06/2017  . COVID-19 virus infection 12/30/2018  . Depression   . Heart palpitations 07/28/2019  . High blood cholesterol level   . Hyperlipidemia 10/27/2011   01/2012 Total  236 LDL  149 HDL  69 Trig  88 07/7492 Total  236 LDL  149 HDL  69 Trig  88  . Hypertension   . Hypertension, essential, benign 01/05/2019  . Malaise and fatigue 07/28/2019  . Obesity with body mass index (BMI) of 30.0 to 39.9 03/27/2017  . Severe episode of recurrent major depressive disorder, without psychotic features (HCC) 03/27/2017    Past Surgical History:  Procedure Laterality Date  . LAPAROSCOPIC OVARIAN Left     Current Medications: Current Meds  Medication Sig  . ascorbic acid (VITAMIN C) 1000 MG tablet Take 1,000 mg by mouth daily.  Marland Kitchen b complex vitamins capsule Take by mouth.  . Biotin 1 MG CAPS Take by mouth.  . Cholecalciferol (VITAMIN D3) 25 MCG (1000 UT) CAPS Take 1,000 Units by mouth daily.  . famotidine (PEPCID) 20 MG tablet Take 20 mg by mouth daily as needed.   . furosemide (LASIX) 20 MG tablet Take 20 mg by mouth  daily as needed.  . hydrochlorothiazide (MICROZIDE) 12.5 MG capsule Take 1 capsule (12.5 mg total) by mouth daily.  Marland Kitchen loratadine (CLARITIN) 10 MG tablet Take 10 mg by mouth daily as needed.   . Multiple Minerals (CALCIUM-MAGNESIUM-ZINC) TABS Take by mouth.  . Multiple Vitamins-Minerals (MULTIVITAMIN WITH MINERALS) tablet Take by mouth.  . zinc gluconate 50 MG tablet Take 50 mg by mouth daily.  . [DISCONTINUED] amLODipine (NORVASC) 10 MG tablet Take 1 tablet (10 mg total) by mouth daily.  . [DISCONTINUED] carvedilol (COREG) 3.125 MG tablet Take 1 tablet (3.125 mg total) by mouth 2 (two) times daily.  . [DISCONTINUED] lisinopril (ZESTRIL) 2.5 MG tablet Take 1 tablet (2.5 mg total) by mouth daily.     Allergies:   Patient has no known allergies.   Social History   Socioeconomic History  . Marital status: Married    Spouse name: Not on file  . Number of children: Not on file  . Years of education: Not on file  . Highest education level: Not on file  Occupational History  . Not on file  Tobacco Use  . Smoking status: Former Smoker    Years: 24.00    Types: Cigarettes    Quit date: 07/06/2018    Years since quitting: 1.7  . Smokeless tobacco: Never Used  . Tobacco comment: intermittent tobacco use since 44 yo.  Substance and  Sexual Activity  . Alcohol use: Not Currently  . Drug use: Never  . Sexual activity: Not on file  Other Topics Concern  . Not on file  Social History Narrative  . Not on file   Social Determinants of Health   Financial Resource Strain:   . Difficulty of Paying Living Expenses: Not on file  Food Insecurity:   . Worried About Programme researcher, broadcasting/film/video in the Last Year: Not on file  . Ran Out of Food in the Last Year: Not on file  Transportation Needs:   . Lack of Transportation (Medical): Not on file  . Lack of Transportation (Non-Medical): Not on file  Physical Activity:   . Days of Exercise per Week: Not on file  . Minutes of Exercise per Session: Not on  file  Stress:   . Feeling of Stress : Not on file  Social Connections:   . Frequency of Communication with Friends and Family: Not on file  . Frequency of Social Gatherings with Friends and Family: Not on file  . Attends Religious Services: Not on file  . Active Member of Clubs or Organizations: Not on file  . Attends Banker Meetings: Not on file  . Marital Status: Not on file     Family History: The patient's family history includes Hyperlipidemia in her father and mother; Hypertension in her father and mother. There is no history of Cancer or Diabetes.  ROS:   Review of Systems  Constitution: Negative for decreased appetite, fever and weight gain.  HENT: Negative for congestion, ear discharge, hoarse voice and sore throat.   Eyes: Negative for discharge, redness, vision loss in right eye and visual halos.  Cardiovascular: Negative for chest pain, dyspnea on exertion, leg swelling, orthopnea and palpitations.  Respiratory: Negative for cough, hemoptysis, shortness of breath and snoring.   Endocrine: Negative for heat intolerance and polyphagia.  Hematologic/Lymphatic: Negative for bleeding problem. Does not bruise/bleed easily.  Skin: Negative for flushing, nail changes, rash and suspicious lesions.  Musculoskeletal: Negative for arthritis, joint pain, muscle cramps, myalgias, neck pain and stiffness.  Gastrointestinal: Negative for abdominal pain, bowel incontinence, diarrhea and excessive appetite.  Genitourinary: Negative for decreased libido, genital sores and incomplete emptying.  Neurological: Negative for brief paralysis, focal weakness, headaches and loss of balance.  Psychiatric/Behavioral: Negative for altered mental status, depression and suicidal ideas.  Allergic/Immunologic: Negative for HIV exposure and persistent infections.    EKGs/Labs/Other Studies Reviewed:    The following studies were reviewed today:   EKG:  None today  Echocardiogram  IMPRESSIONS  1. Left ventricular ejection fraction, by visual estimation, is 60 to 65%. The left ventricle has normal function. There is mildly increased left ventricular hypertrophy.  2. Left ventricular diastolic parameters are consistent with Grade I diastolic dysfunction (impaired relaxation).  3. The left ventricle has no regional wall motion abnormalities.  4. Global right ventricle has normal systolic function.The right  ventricular size is normal. No increase in right ventricular wall thickness.  5. Left atrial size was mildly dilated  Zio monitor The patient wore the monitor for 7 days 0 hours starting August 02, 2019. Indication: Palpitations  The minimum heart rate was 54 bpm, maximum heart rate was 182 bpm, and average heart rate was 87 bpm. Predominant underlying rhythm was Sinus Rhythm.  1 run of Ventricular Tachycardia occurred lasting 4 beats with a maximum rate of 182 bpm (average 134 bpm).   Premature atrial complexes were rare less than 1%. Premature  Ventricular complexes were rare less than 1%.  No pauses, No AV block and no atrial fibrillation present.  9 patient triggered events: 7 associated with premature ventricular complex, 2 associated with premature atrial complexes. 4 diary events all associated with premature ventricular complexes.    Conclusion: This study is remarkable for the following:               1.  Nonsustained ventricular tachycardia (one 4 beat run)               2.  Symptomatic premature ventricular complexes.  Recent Labs: No results found for requested labs within last 8760 hours.  Recent Lipid Panel No results found for: CHOL, TRIG, HDL, CHOLHDL, VLDL, LDLCALC, LDLDIRECT  Physical Exam:    VS:  BP 112/72   Pulse 100   Ht  (1.626 m)   Wt 241 lb (109.3 kg)   SpO2 94%   BMI 41.37 kg/m     Wt Readings from Last 3 Encounters:  03/30/20 241 lb (109.3 kg)  09/26/19 230 lb (104.3 kg)  08/26/19 225 lb (102.1 kg)      GEN: Well nourished, well developed in no acute distress HEENT: Normal NECK: No JVD; No carotid bruits LYMPHATICS: No lymphadenopathy CARDIAC: S1S2 noted,RRR, no murmurs, rubs, gallops RESPIRATORY:  Clear to auscultation without rales, wheezing or rhonchi  ABDOMEN: Soft, non-tender, non-distended, +bowel sounds, no guarding. EXTREMITIES: No edema, No cyanosis, no clubbing MUSCULOSKELETAL:  No deformity  SKIN: Warm and dry NEUROLOGIC:  Alert and oriented x 3, non-focal PSYCHIATRIC:  Normal affect, good insight  ASSESSMENT:    1. Hypertension, essential, benign   2. Obesity with body mass index (BMI) of 30.0 to 39.9   3. NSVT (nonsustained ventricular tachycardia) (HCC)   4. Symptomatic PVCs    PLAN:      I am going to stop the amlodipine as this may be contributing to her worsening leg edema.  I am going to increase her carvedilol to 6.25 mg twice daily as well as increase lisinopril to 5 mg daily.  I am hoping that this will help her blood pressure goal as well as decrease her symptoms for palpitations.  The patient understands the need to lose weight with diet and exercise. We have discussed specific strategies for this.  She will take her blood pressure for the next 2 weeks and send this information to me via MyChart and he will  The patient is in agreement with the above plan. The patient left the office in stable condition.  The patient will follow up in   Medication Adjustments/Labs and Tests Ordered: Current medicines are reviewed at length with the patient today.  Concerns regarding medicines are outlined above.  No orders of the defined types were placed in this encounter.  Meds ordered this encounter  Medications  . lisinopril (ZESTRIL) 5 MG tablet    Sig: Take 1 tablet (5 mg total) by mouth daily.    Dispense:  90 tablet    Refill:  3  . carvedilol (COREG) 6.25 MG tablet    Sig: Take 1 tablet (6.25 mg total) by mouth 2 (two) times daily.    Dispense:  180  tablet    Refill:  3    Patient Instructions  Medication Instructions:  1) Stop Amlodipine   2) Increase Lisinopril to 5 mg daily   3) Increase Carvedilol to 6.25 mg twice a day   *If you need a refill on your cardiac medications before  your next appointment, please call your pharmacy*   Lab Work: None ordered   If you have labs (blood work) drawn today and your tests are completely normal, you will receive your results only by: Marland Kitchen MyChart Message (if you have MyChart) OR . A paper copy in the mail If you have any lab test that is abnormal or we need to change your treatment, we will call you to review the results.   Testing/Procedures: None ordered    Follow-Up: At [facility], you and your health needs are our priority.  As part of our continuing mission to provide you with exceptional heart care, we have created designated Provider Care Teams.  These Care Teams include your primary Cardiologist (physician) and Advanced Practice Providers (APPs -  Physician Assistants and Nurse Practitioners) who all work together to provide you with the care you need, when you need it.  We recommend signing up for the patient portal called "MyChart".  Sign up information is provided on this After Visit Summary.  MyChart is used to connect with patients for Virtual Visits (Telemedicine).  Patients are able to view lab/test results, encounter notes, upcoming appointments, etc.  Non-urgent messages can be sent to your provider as well.   To learn more about what you can do with MyChart, go to [email or web address].    Your next appointment:   3 month(s)  The format for your next appointment:   In Person  Provider:   Thomasene Ripple, DO   Other Instructions None      Adopting a Healthy Lifestyle.  Know what a healthy weight is for you (roughly BMI <25) and aim to maintain this   Aim for 7+ servings of fruits and vegetables daily   65-80+ fluid ounces of water or unsweet tea for  healthy kidneys   Limit to max 1 drink of alcohol per day; avoid smoking/tobacco   Limit animal fats in diet for cholesterol and heart health - choose grass fed whenever available   Avoid highly processed foods, and foods high in saturated/trans fats   Aim for low stress - take time to unwind and care for your mental health   Aim for 150 min of moderate intensity exercise weekly for heart health, and weights twice weekly for bone health   Aim for 7-9 hours of sleep daily   When it comes to diets, agreement about the perfect plan isnt easy to find, even among the experts. Experts at the [facility] [employer] developed an idea known as the Healthy Eating Plate. Just imagine a plate divided into logical, healthy portions.   The emphasis is on diet quality:   Load up on vegetables and fruits - one-half of your plate: Aim for color and variety, and remember that potatoes dont count.   Go for whole grains - one-quarter of your plate: Whole wheat, barley, wheat berries, quinoa, oats, brown rice, and foods made with them. If you want pasta, go with whole wheat pasta.   Protein power - one-quarter of your plate: Fish, chicken, beans, and nuts are all healthy, versatile protein sources. Limit red meat.   The diet, however, does go beyond the plate, offering a few other suggestions.   Use healthy plant oils, such as olive, canola, soy, corn, sunflower and peanut. Check the labels, and avoid partially hydrogenated oil, which have unhealthy trans fats.   If youre thirsty, drink water. Coffee and tea are good in moderation, but skip sugary drinks and limit milk and  dairy products to one or two daily servings.   The type of carbohydrate in the diet is more important than the amount. Some sources of carbohydrates, such as vegetables, fruits, whole grains, and beans-are healthier than others.   Finally, stay active  Signed, Thomasene RippleKardie Benaiah Behan, DO  03/30/2020 1:42 PM    [facility]

## 2020-04-16 MED FILL — LISINOPRIL 2.5 MG TABLET: 2.5 | 90 days supply | Qty: 90 | Fill #1

## 2020-04-17 ENCOUNTER — Other Ambulatory Visit: Payer: Self-pay | Admitting: Cardiology

## 2020-04-17 MED ORDER — LISINOPRIL 10 MG PO TABS: 10.0000 mg | ORAL_TABLET | Freq: Every day | ORAL | 3 refills | Status: DC

## 2020-04-17 MED FILL — LISINOPRIL 10 MG TABS: 10 | 90 days supply | Qty: 90 | Fill #0

## 2020-04-23 MED FILL — ESCITALOPRAM 20 MG TABLET: 20 | 90 days supply | Qty: 90 | Fill #3

## 2020-04-23 MED FILL — HYDROCHLOROTHIAZIDE 12.5 MG: 12.5 | 90 days supply | Qty: 90 | Fill #1

## 2020-06-11 ENCOUNTER — Ambulatory Visit (INDEPENDENT_AMBULATORY_CARE_PROVIDER_SITE_OTHER): Payer: 59

## 2020-06-11 ENCOUNTER — Encounter: Payer: Self-pay | Admitting: Podiatry

## 2020-06-11 ENCOUNTER — Ambulatory Visit: Payer: 59 | Admitting: Podiatry

## 2020-06-11 ENCOUNTER — Other Ambulatory Visit: Payer: Self-pay

## 2020-06-11 ENCOUNTER — Other Ambulatory Visit: Payer: Self-pay | Admitting: *Deleted

## 2020-06-11 DIAGNOSIS — M79672 Pain in left foot: Secondary | ICD-10-CM

## 2020-06-11 DIAGNOSIS — M722 Plantar fascial fibromatosis: Secondary | ICD-10-CM | POA: Diagnosis not present

## 2020-06-11 DIAGNOSIS — M216X9 Other acquired deformities of unspecified foot: Secondary | ICD-10-CM | POA: Diagnosis not present

## 2020-06-11 MED ORDER — TRIAMCINOLONE ACETONIDE 10 MG/ML IJ SUSP
5.0000 mg | Freq: Once | INTRAMUSCULAR | Status: AC
  Administered 2020-06-11: 5 mg via INTRA_ARTICULAR

## 2020-06-11 MED ORDER — DEXAMETHASONE SODIUM PHOSPHATE 120 MG/30ML IJ SOLN
4.0000 mg | Freq: Once | INTRAMUSCULAR | Status: AC
  Administered 2020-06-11: 4 mg via INTRA_ARTICULAR

## 2020-06-11 NOTE — Progress Notes (Signed)
  Subjective:  Patient ID: [name], female    DOB: [birth year],  MRN: [id number]  Chief Complaint  Patient presents with  . Plantar Fasciitis    i have some heel and arch pain on the left foot    44 y.o. female presents with the above complaint. History confirmed with patient. States it hurts most in the AM and as she walks during the day.  Objective:  Physical Exam: warm, good capillary refill, no trophic changes or ulcerative lesions, normal DP and PT pulses and normal sensory exam. Left Foot: tenderness to palpation medial calcaneal tuber, no pain with calcaneal squeeze, decreased ankle joint ROM and +Silverskiold test  Radiographs: X-ray of the left foot: no evidence of calcaneal stress fracture and hallux valgus deformity  Assessment:   1. Plantar fasciitis   2. Pain of left heel   3. Equinus deformity of foot      Plan:  Patient was evaluated and treated and all questions answered.  Plantar Fasciitis -XR reviewed with patient -Educated patient on stretching and icing of the affected limb -Plantar fascial brace dispensed -Injection delivered to the plantar fascia of the left foot.  Procedure: Injection Tendon/Ligament Consent: Verbal consent obtained. Location: Left plantar fascia at the glabrous junction; medial approach. Skin Prep: Alcohol. Injectate: 1 cc 0.5% marcaine plain, 1 cc dexamethasone phosphate, 0.5 cc kenalog 10. Disposition: Patient tolerated procedure well. Injection site dressed with a band-aid.    Return in about 4 weeks (around 07/09/2020).

## 2020-06-11 NOTE — Patient Instructions (Signed)

## 2020-06-22 ENCOUNTER — Ambulatory Visit: Payer: 59 | Admitting: Cardiology

## 2020-06-27 DIAGNOSIS — E7800 Pure hypercholesterolemia, unspecified: Secondary | ICD-10-CM | POA: Insufficient documentation

## 2020-06-27 DIAGNOSIS — F32A Depression, unspecified: Secondary | ICD-10-CM | POA: Insufficient documentation

## 2020-06-28 ENCOUNTER — Ambulatory Visit: Payer: 59 | Admitting: Cardiology

## 2020-06-28 ENCOUNTER — Encounter: Payer: Self-pay | Admitting: Cardiology

## 2020-06-28 ENCOUNTER — Other Ambulatory Visit: Payer: Self-pay

## 2020-06-28 VITALS — BP 124/82 | HR 74 | Ht 65.0 in | Wt 242.2 lb

## 2020-06-28 DIAGNOSIS — E669 Obesity, unspecified: Secondary | ICD-10-CM | POA: Diagnosis not present

## 2020-06-28 DIAGNOSIS — I4729 Other ventricular tachycardia: Secondary | ICD-10-CM

## 2020-06-28 DIAGNOSIS — I472 Ventricular tachycardia: Secondary | ICD-10-CM

## 2020-06-28 DIAGNOSIS — I10 Essential (primary) hypertension: Secondary | ICD-10-CM | POA: Diagnosis not present

## 2020-06-28 NOTE — Patient Instructions (Signed)

## 2020-06-28 NOTE — Progress Notes (Signed)
Cardiology Office Note:    Date:  06/28/2020   ID:  Molly Summers, DOB 1976-04-17, MRN [id number]  PCP:  Audie Pinto, FNP  Cardiologist:  Thomasene Ripple, DO  Electrophysiologist:  None   Referring MD: Audie Pinto, FNP    History of Present Illness:    Molly Summers is a 44 y.o. female with a hx of hypertension, hyperlipidemia, recent Covid infection, NSVT and symptomatic PVCs presents today for follow-up visit.  I saw the patient in March 30, 2020 at that time we stopped amlodipine and increase her lisinopril to 5 mg daily.  In the interim her blood pressure was not well controlled we therefore increase the lisinopril 10 mg a day.  Kept her on carvedilol 6.25 mg twice a day and hydrochlorothiazide 12.5 mg daily.  Past Medical History:  Diagnosis Date  . Annual physical exam 03/27/2017  . Attention deficit hyperactivity disorder (ADHD), combined type 04/06/2017  . COVID-19 virus infection 12/30/2018  . Depression   . Heart palpitations 07/28/2019  . High blood cholesterol level   . Hyperlipidemia 10/27/2011   01/2012 Total  236 LDL  149 HDL  69 Trig  88 08/2631 Total  236 LDL  149 HDL  69 Trig  88  . Hypertension   . Hypertension, essential, benign 01/05/2019  . Malaise and fatigue 07/28/2019  . Obesity with body mass index (BMI) of 30.0 to 39.9 03/27/2017  . Severe episode of recurrent major depressive disorder, without psychotic features (HCC) 03/27/2017    Past Surgical History:  Procedure Laterality Date  . LAPAROSCOPIC OVARIAN Left     Current Medications: Current Meds  Medication Sig  . ascorbic acid (VITAMIN C) 1000 MG tablet Take 1,000 mg by mouth daily.  Marland Kitchen b complex vitamins capsule Take by mouth.  . Biotin 1 MG CAPS Take by mouth.  . carvedilol (COREG) 6.25 MG tablet Take 1 tablet (6.25 mg total) by mouth 2 (two) times daily.  . Cholecalciferol (VITAMIN D3) 25 MCG (1000 UT) CAPS Take 1,000 Units by mouth daily.  . famotidine (PEPCID) 20 MG tablet  Take 20 mg by mouth daily as needed.   . furosemide (LASIX) 20 MG tablet Take 20 mg by mouth daily as needed.  . hydrochlorothiazide (MICROZIDE) 12.5 MG capsule Take 1 capsule (12.5 mg total) by mouth daily.  Marland Kitchen lisinopril (ZESTRIL) 10 MG tablet Take 1 tablet (10 mg total) by mouth daily.  Marland Kitchen loratadine (CLARITIN) 10 MG tablet Take 10 mg by mouth daily as needed.   . Multiple Minerals (CALCIUM-MAGNESIUM-ZINC) TABS Take by mouth.  . Multiple Vitamins-Minerals (MULTIVITAMIN WITH MINERALS) tablet Take by mouth.  . zinc gluconate 50 MG tablet Take 50 mg by mouth daily.     Allergies:   Patient has no known allergies.   Social History   Socioeconomic History  . Marital status: Married    Spouse name: Not on file  . Number of children: Not on file  . Years of education: Not on file  . Highest education level: Not on file  Occupational History  . Not on file  Tobacco Use  . Smoking status: Former Smoker    Years: 24.00    Types: Cigarettes    Quit date: 07/06/2018    Years since quitting: 1.9  . Smokeless tobacco: Never Used  . Tobacco comment: intermittent tobacco use since 44 yo.  Substance and Sexual Activity  . Alcohol use: Not Currently  . Drug use: Never  . Sexual activity: Not  on file  Other Topics Concern  . Not on file  Social History Narrative  . Not on file   Social Determinants of Health   Financial Resource Strain: Not on file  Food Insecurity: Not on file  Transportation Needs: Not on file  Physical Activity: Not on file  Stress: Not on file  Social Connections: Not on file     Family History: The patient's family history includes Hyperlipidemia in her father and mother; Hypertension in her father and mother. There is no history of Cancer or Diabetes.  ROS:   Review of Systems  Constitution: Negative for decreased appetite, fever and weight gain.  HENT: Negative for congestion, ear discharge, hoarse voice and sore throat.   Eyes: Negative for discharge,  redness, vision loss in right eye and visual halos.  Cardiovascular: Negative for chest pain, dyspnea on exertion, leg swelling, orthopnea and palpitations.  Respiratory: Negative for cough, hemoptysis, shortness of breath and snoring.   Endocrine: Negative for heat intolerance and polyphagia.  Hematologic/Lymphatic: Negative for bleeding problem. Does not bruise/bleed easily.  Skin: Negative for flushing, nail changes, rash and suspicious lesions.  Musculoskeletal: Negative for arthritis, joint pain, muscle cramps, myalgias, neck pain and stiffness.  Gastrointestinal: Negative for abdominal pain, bowel incontinence, diarrhea and excessive appetite.  Genitourinary: Negative for decreased libido, genital sores and incomplete emptying.  Neurological: Negative for brief paralysis, focal weakness, headaches and loss of balance.  Psychiatric/Behavioral: Negative for altered mental status, depression and suicidal ideas.  Allergic/Immunologic: Negative for HIV exposure and persistent infections.    EKGs/Labs/Other Studies Reviewed:    The following studies were reviewed today:   EKG: None today  Echocardiogram IMPRESSIONS  1. Left ventricular ejection fraction, by visual estimation, is 60 to 65%. The left ventricle has normal function. There is mildly increased left ventricular hypertrophy.  2. Left ventricular diastolic parameters are consistent with Grade I diastolic dysfunction (impaired relaxation).  3. The left ventricle has no regional wall motion abnormalities.  4. Global right ventricle has normal systolic function.The right  ventricular size is normal. No increase in right ventricular wall thickness.  5. Left atrial size was mildly dilated  Zio monitor The patient wore the monitor for 7 days 0 hours starting August 02, 2019. Indication: Palpitations  The minimum heart rate was 54 bpm, maximum heart rate was 182 bpm, and average heart rate was 87 bpm. Predominant underlying  rhythm was Sinus Rhythm.  1 run of Ventricular Tachycardia occurred lasting 4 beats with a maximum rate of 182 bpm (average 134 bpm).   Premature atrial complexes were rare less than 1%. Premature Ventricular complexes were rare less than 1%.  No pauses, No AV block and no atrial fibrillation present.  9 patient triggered events: 7 associated with premature ventricular complex, 2 associated with premature atrial complexes. 4 diary events all associated with premature ventricular complexes.   Conclusion: This study is remarkable for the following: 1. Nonsustained ventricular tachycardia (one 4 beat run) 2. Symptomatic premature ventricular complexes.  Recent Labs: No results found for requested labs within last 8760 hours.  Recent Lipid Panel No results found for: CHOL, TRIG, HDL, CHOLHDL, VLDL, LDLCALC, LDLDIRECT  Physical Exam:    VS:  BP 124/82   Pulse 74   Ht 5\' 5"  (1.651 m)   Wt 242 lb 3.2 oz (109.9 kg)   SpO2 98%   BMI 40.30 kg/m     Wt Readings from Last 3 Encounters:  06/28/20 242 lb 3.2 oz (109.9 kg)  03/30/20 241 lb (109.3 kg)  09/26/19 230 lb (104.3 kg)     GEN: Well nourished, well developed in no acute distress HEENT: Normal NECK: No JVD; No carotid bruits LYMPHATICS: No lymphadenopathy CARDIAC: S1S2 noted,RRR, no murmurs, rubs, gallops RESPIRATORY:  Clear to auscultation without rales, wheezing or rhonchi  ABDOMEN: Soft, non-tender, non-distended, +bowel sounds, no guarding. EXTREMITIES: No edema, No cyanosis, no clubbing MUSCULOSKELETAL:  No deformity  SKIN: Warm and dry NEUROLOGIC:  Alert and oriented x 3, non-focal PSYCHIATRIC:  Normal affect, good insight  ASSESSMENT:    1. Hypertension, essential, benign   2. NSVT (nonsustained ventricular tachycardia) (HCC)   3. Obesity with body mass index (BMI) of 30.0 to 39.9    PLAN:     She is happy with her new medication regimen.  I will therefore keep her on the  current dose of lisinopril 10 mg a day, carvedilol 6.25 mg twice a day, hydrochlorothiazide 12.5 mg daily and she takes Lasix 20 mg daily.  The patient understands the need to lose weight with diet and exercise. We have discussed specific strategies for this.  The patient is in agreement with the above plan. The patient left the office in stable condition.  The patient will follow up in 12 months or sooner if needed.   Medication Adjustments/Labs and Tests Ordered: Current medicines are reviewed at length with the patient today.  Concerns regarding medicines are outlined above.  No orders of the defined types were placed in this encounter.  No orders of the defined types were placed in this encounter.   Patient Instructions  Medication Instructions:  Your physician recommends that you continue on your current medications as directed. Please refer to the Current Medication list given to you today.  *If you need a refill on your cardiac medications before your next appointment, please call your pharmacy*   Lab Work: None. If you have labs (blood work) drawn today and your tests are completely normal, you will receive your results only by: Marland Kitchen MyChart Message (if you have MyChart) OR . A paper copy in the mail If you have any lab test that is abnormal or we need to change your treatment, we will call you to review the results.   Testing/Procedures: None.   Follow-Up: At [facility], you and your health needs are our priority.  As part of our continuing mission to provide you with exceptional heart care, we have created designated Provider Care Teams.  These Care Teams include your primary Cardiologist (physician) and Advanced Practice Providers (APPs -  Physician Assistants and Nurse Practitioners) who all work together to provide you with the care you need, when you need it.  We recommend signing up for the patient portal called "MyChart".  Sign up information is provided on this  After Visit Summary.  MyChart is used to connect with patients for Virtual Visits (Telemedicine).  Patients are able to view lab/test results, encounter notes, upcoming appointments, etc.  Non-urgent messages can be sent to your provider as well.   To learn more about what you can do with MyChart, go to [email or web address].    Your next appointment:   12 month(s)  The format for your next appointment:   In Person  Provider:   Thomasene Ripple, DO   Other Instructions      Adopting a Healthy Lifestyle.  Know what a healthy weight is for you (roughly BMI <25) and aim to maintain this   Aim for 7+ servings of fruits  and vegetables daily   65-80+ fluid ounces of water or unsweet tea for healthy kidneys   Limit to max 1 drink of alcohol per day; avoid smoking/tobacco   Limit animal fats in diet for cholesterol and heart health - choose grass fed whenever available   Avoid highly processed foods, and foods high in saturated/trans fats   Aim for low stress - take time to unwind and care for your mental health   Aim for 150 min of moderate intensity exercise weekly for heart health, and weights twice weekly for bone health   Aim for 7-9 hours of sleep daily   When it comes to diets, agreement about the perfect plan isnt easy to find, even among the experts. Experts at the [employer] developed an idea known as the Healthy Eating Plate. Just imagine a plate divided into logical, healthy portions.   The emphasis is on diet quality:   Load up on vegetables and fruits - one-half of your plate: Aim for color and variety, and remember that potatoes dont count.   Go for whole grains - one-quarter of your plate: Whole wheat, barley, wheat berries, quinoa, oats, brown rice, and foods made with them. If you want pasta, go with whole wheat pasta.   Protein power - one-quarter of your plate: Fish, chicken, beans, and nuts are all healthy, versatile protein sources.  Limit red meat.   The diet, however, does go beyond the plate, offering a few other suggestions.   Use healthy plant oils, such as olive, canola, soy, corn, sunflower and peanut. Check the labels, and avoid partially hydrogenated oil, which have unhealthy trans fats.   If youre thirsty, drink water. Coffee and tea are good in moderation, but skip sugary drinks and limit milk and dairy products to one or two daily servings.   The type of carbohydrate in the diet is more important than the amount. Some sources of carbohydrates, such as vegetables, fruits, whole grains, and beans-are healthier than others.   Finally, stay active  Signed, Thomasene RippleKardie Harace Mccluney, DO  06/28/2020 10:52 AM    [facility]

## 2020-07-09 ENCOUNTER — Ambulatory Visit: Payer: 59 | Admitting: Podiatry

## 2020-07-11 ENCOUNTER — Encounter: Payer: Self-pay | Admitting: Podiatry

## 2020-07-12 ENCOUNTER — Ambulatory Visit: Payer: 59 | Admitting: Podiatry

## 2020-07-19 ENCOUNTER — Other Ambulatory Visit: Payer: Self-pay

## 2020-07-19 ENCOUNTER — Ambulatory Visit (INDEPENDENT_AMBULATORY_CARE_PROVIDER_SITE_OTHER): Payer: 59 | Admitting: Podiatry

## 2020-07-19 ENCOUNTER — Encounter: Payer: Self-pay | Admitting: Podiatry

## 2020-07-19 DIAGNOSIS — L600 Ingrowing nail: Secondary | ICD-10-CM | POA: Diagnosis not present

## 2020-07-19 DIAGNOSIS — M722 Plantar fascial fibromatosis: Secondary | ICD-10-CM

## 2020-07-19 DIAGNOSIS — M79676 Pain in unspecified toe(s): Secondary | ICD-10-CM

## 2020-07-19 MED ORDER — BETAMETHASONE SOD PHOS & ACET 6 (3-3) MG/ML IJ SUSP
6.0000 mg | Freq: Once | INTRAMUSCULAR | Status: AC
  Administered 2020-07-19: 6 mg

## 2020-07-19 NOTE — Progress Notes (Signed)
  Subjective:  Patient ID: Molly Summers, female    DOB: 1976/01/03,  MRN: [id number]  Chief Complaint  Patient presents with  . Plantar Fasciitis    Heel pain is better and the shot and brace did help  . Nail Problem    The corner of the left big toe is sore and I did get a pedicure    45 y.o. female presents with the above complaint. History confirmed with patient. States it hurts most in the AM and as she walks during the day.  Objective:  Physical Exam: warm, good capillary refill, no trophic changes or ulcerative lesions, normal DP and PT pulses and normal sensory exam. Left Foot: tenderness to palpation medial calcaneal tuber, no pain with calcaneal squeeze, decreased ankle joint ROM and +Silverskiold test. POP left hallux nail medial border with slight ingrowing nail.  Assessment:   1. Ingrown nail   2. Pain around toenail   3. Plantar fasciitis    Plan:  Patient was evaluated and treated and all questions answered.  Plantar Fasciitis -Continue stretching and icing. -Repeat injection as below  Procedure: Injection Tendon/Ligament Consent: Verbal consent obtained. Location: Left plantar fascia at the glabrous junction; medial approach. Skin Prep: Alcohol. Injectate: 1 cc 0.5% marcaine plain, 1 cc celestone Disposition: Patient tolerated procedure well. Injection site dressed with a band-aid.  Ingrown Nail -Debrided in slant back fashion to patient relief.  No follow-ups on file.

## 2020-07-20 ENCOUNTER — Other Ambulatory Visit (HOSPITAL_COMMUNITY): Payer: Self-pay | Admitting: Family Medicine

## 2020-07-20 MED FILL — ESCITALOPRAM 20 MG TABLET: 20 | 90 days supply | Qty: 90 | Fill #0

## 2020-07-20 MED FILL — LISINOPRIL 10 MG TABS: 10 | 90 days supply | Qty: 90 | Fill #1

## 2020-07-20 MED FILL — HYDROCHLOROTHIAZIDE 12.5 MG: 12.5 | 90 days supply | Qty: 90 | Fill #2

## 2020-07-20 MED FILL — CARVEDILOL 6.25 MG TABLET: 6.25 | 90 days supply | Qty: 180 | Fill #1

## 2020-07-23 ENCOUNTER — Ambulatory Visit: Payer: 59 | Admitting: Podiatry

## 2020-07-27 ENCOUNTER — Other Ambulatory Visit (HOSPITAL_COMMUNITY): Payer: Self-pay | Admitting: Family Medicine

## 2020-07-27 DIAGNOSIS — Z8616 Personal history of COVID-19: Secondary | ICD-10-CM | POA: Diagnosis not present

## 2020-07-27 DIAGNOSIS — I10 Essential (primary) hypertension: Secondary | ICD-10-CM | POA: Diagnosis not present

## 2020-07-27 DIAGNOSIS — F332 Major depressive disorder, recurrent severe without psychotic features: Secondary | ICD-10-CM | POA: Diagnosis not present

## 2020-07-27 DIAGNOSIS — R5381 Other malaise: Secondary | ICD-10-CM | POA: Diagnosis not present

## 2020-07-27 DIAGNOSIS — Z6838 Body mass index (BMI) 38.0-38.9, adult: Secondary | ICD-10-CM | POA: Diagnosis not present

## 2020-07-27 DIAGNOSIS — E782 Mixed hyperlipidemia: Secondary | ICD-10-CM | POA: Diagnosis not present

## 2020-07-27 DIAGNOSIS — E669 Obesity, unspecified: Secondary | ICD-10-CM | POA: Diagnosis not present

## 2020-07-27 DIAGNOSIS — Z87891 Personal history of nicotine dependence: Secondary | ICD-10-CM | POA: Diagnosis not present

## 2020-07-27 DIAGNOSIS — R5383 Other fatigue: Secondary | ICD-10-CM | POA: Diagnosis not present

## 2020-07-27 DIAGNOSIS — F902 Attention-deficit hyperactivity disorder, combined type: Secondary | ICD-10-CM | POA: Diagnosis not present

## 2020-07-27 MED FILL — PHENTERMINE 37.5 MG TABLET: 37.5 | 30 days supply | Qty: 30 | Fill #0

## 2020-07-28 ENCOUNTER — Other Ambulatory Visit (HOSPITAL_COMMUNITY): Payer: Self-pay | Admitting: Family Medicine

## 2020-07-28 MED FILL — ROSUVASTATIN CALCIUM 10 MG: 10 | 90 days supply | Qty: 90 | Fill #0

## 2020-08-01 ENCOUNTER — Other Ambulatory Visit (HOSPITAL_COMMUNITY): Payer: Self-pay | Admitting: Family Medicine

## 2020-08-30 ENCOUNTER — Other Ambulatory Visit (HOSPITAL_COMMUNITY): Payer: Self-pay | Admitting: Family Medicine

## 2020-08-30 MED FILL — PHENTERMINE 37.5 MG TABLET: 37.5 | 30 days supply | Qty: 30 | Fill #0

## 2020-09-25 ENCOUNTER — Other Ambulatory Visit (HOSPITAL_BASED_OUTPATIENT_CLINIC_OR_DEPARTMENT_OTHER): Payer: Self-pay

## 2020-10-02 MED FILL — PHENTERMINE 37.5 MG TABLET: 37.5 | 30 days supply | Qty: 30 | Fill #1

## 2020-10-11 ENCOUNTER — Other Ambulatory Visit (HOSPITAL_COMMUNITY): Payer: Self-pay

## 2020-10-25 ENCOUNTER — Other Ambulatory Visit (HOSPITAL_COMMUNITY): Payer: Self-pay

## 2020-10-25 ENCOUNTER — Telehealth: Payer: Self-pay | Admitting: Cardiology

## 2020-10-25 MED ORDER — HYDROCHLOROTHIAZIDE 12.5 MG PO CAPS
12.5000 mg | ORAL_CAPSULE | Freq: Every day | ORAL | 3 refills | Status: DC
Start: 2020-10-25 — End: 2023-11-12
  Filled 2020-10-25: qty 90, 90d supply, fill #0
  Filled 2021-01-22: qty 90, 90d supply, fill #1
  Filled 2021-04-28: qty 90, 90d supply, fill #2

## 2020-10-25 MED FILL — Lisinopril Tab 10 MG: ORAL | 90 days supply | Qty: 90 | Fill #0 | Status: AC

## 2020-10-25 MED FILL — Rosuvastatin Calcium Tab 10 MG: ORAL | 90 days supply | Qty: 90 | Fill #0 | Status: AC

## 2020-10-25 MED FILL — Carvedilol Tab 6.25 MG: ORAL | 90 days supply | Qty: 180 | Fill #0 | Status: AC

## 2020-10-25 NOTE — Telephone Encounter (Signed)
*  STAT* If patient is at the pharmacy, call can be transferred to refill team.   1. Which medications need to be refilled? (please list name of each medication and dose if known) hydrochlorothiazide (MICROZIDE) 12.5 MG capsule  2. Which pharmacy/location (including street and city if local pharmacy) is medication to be sent to? [facility]  3. Do they need a 30 day or 90 day supply? 90 day   Patient is out of medication

## 2020-10-25 NOTE — Telephone Encounter (Signed)
Refill sent in per request.  

## 2020-10-26 ENCOUNTER — Other Ambulatory Visit (HOSPITAL_COMMUNITY): Payer: Self-pay

## 2020-10-26 MED ORDER — ESCITALOPRAM OXALATE 20 MG PO TABS
1.0000 | ORAL_TABLET | Freq: Every day | ORAL | 0 refills | Status: DC
  Filled 2020-10-26: qty 90, 90d supply, fill #0

## 2020-10-26 MED ORDER — PHENTERMINE HCL 37.5 MG PO TABS
37.5000 mg | ORAL_TABLET | Freq: Every day | ORAL | 1 refills | Status: DC
  Filled 2020-10-26: qty 30, 30d supply, fill #0

## 2020-11-01 ENCOUNTER — Other Ambulatory Visit (HOSPITAL_COMMUNITY): Payer: Self-pay

## 2020-11-27 ENCOUNTER — Other Ambulatory Visit (HOSPITAL_COMMUNITY): Payer: Self-pay

## 2021-01-22 ENCOUNTER — Other Ambulatory Visit (HOSPITAL_COMMUNITY): Payer: Self-pay

## 2021-01-22 MED ORDER — ESCITALOPRAM OXALATE 20 MG PO TABS
20.0000 mg | ORAL_TABLET | Freq: Every day | ORAL | 0 refills | Status: DC
  Filled 2021-01-22: qty 90, 90d supply, fill #0

## 2021-01-22 MED FILL — Carvedilol Tab 6.25 MG: ORAL | 90 days supply | Qty: 180 | Fill #1 | Status: AC

## 2021-01-22 MED FILL — Lisinopril Tab 10 MG: ORAL | 90 days supply | Qty: 90 | Fill #1 | Status: AC

## 2021-01-22 MED FILL — Rosuvastatin Calcium Tab 10 MG: ORAL | 90 days supply | Qty: 90 | Fill #1 | Status: AC

## 2021-01-24 ENCOUNTER — Other Ambulatory Visit (HOSPITAL_COMMUNITY): Payer: Self-pay

## 2021-01-24 DIAGNOSIS — I10 Essential (primary) hypertension: Secondary | ICD-10-CM | POA: Diagnosis not present

## 2021-01-24 DIAGNOSIS — F902 Attention-deficit hyperactivity disorder, combined type: Secondary | ICD-10-CM | POA: Diagnosis not present

## 2021-01-24 DIAGNOSIS — Z8616 Personal history of COVID-19: Secondary | ICD-10-CM | POA: Diagnosis not present

## 2021-01-24 DIAGNOSIS — E669 Obesity, unspecified: Secondary | ICD-10-CM | POA: Diagnosis not present

## 2021-01-24 DIAGNOSIS — F332 Major depressive disorder, recurrent severe without psychotic features: Secondary | ICD-10-CM | POA: Diagnosis not present

## 2021-01-24 DIAGNOSIS — M25551 Pain in right hip: Secondary | ICD-10-CM | POA: Diagnosis not present

## 2021-01-24 DIAGNOSIS — I472 Ventricular tachycardia: Secondary | ICD-10-CM | POA: Diagnosis not present

## 2021-01-24 DIAGNOSIS — E782 Mixed hyperlipidemia: Secondary | ICD-10-CM | POA: Diagnosis not present

## 2021-01-24 DIAGNOSIS — R5381 Other malaise: Secondary | ICD-10-CM | POA: Diagnosis not present

## 2021-01-24 DIAGNOSIS — R600 Localized edema: Secondary | ICD-10-CM | POA: Diagnosis not present

## 2021-01-24 DIAGNOSIS — R5383 Other fatigue: Secondary | ICD-10-CM | POA: Diagnosis not present

## 2021-01-24 MED ORDER — ESCITALOPRAM OXALATE 20 MG PO TABS
20.0000 mg | ORAL_TABLET | Freq: Every day | ORAL | 3 refills | Status: DC
  Filled 2021-01-24: qty 90, 90d supply, fill #0

## 2021-04-28 ENCOUNTER — Other Ambulatory Visit: Payer: Self-pay | Admitting: Cardiology

## 2021-04-28 ENCOUNTER — Other Ambulatory Visit (HOSPITAL_COMMUNITY): Payer: Self-pay

## 2021-04-28 MED FILL — Rosuvastatin Calcium Tab 10 MG: ORAL | 90 days supply | Qty: 90 | Fill #2 | Status: AC

## 2021-04-29 ENCOUNTER — Other Ambulatory Visit (HOSPITAL_COMMUNITY): Payer: Self-pay

## 2021-04-29 MED ORDER — LISINOPRIL 10 MG PO TABS
10.0000 mg | ORAL_TABLET | Freq: Every day | ORAL | 1 refills | Status: DC
  Filled 2021-04-29: qty 30, 30d supply, fill #0
  Filled 2021-06-02: qty 30, 30d supply, fill #1

## 2021-04-29 MED ORDER — CARVEDILOL 6.25 MG PO TABS
6.2500 mg | ORAL_TABLET | Freq: Two times a day (BID) | ORAL | 1 refills | Status: DC
  Filled 2021-04-29: qty 60, 30d supply, fill #0
  Filled 2021-06-02: qty 60, 30d supply, fill #1

## 2021-04-29 MED ORDER — ESCITALOPRAM OXALATE 20 MG PO TABS
20.0000 mg | ORAL_TABLET | Freq: Every day | ORAL | 1 refills | Status: DC
  Filled 2021-04-29: qty 90, 90d supply, fill #0

## 2021-06-03 ENCOUNTER — Other Ambulatory Visit (HOSPITAL_COMMUNITY): Payer: Self-pay

## 2021-06-04 IMAGING — DX PORTABLE CHEST - 1 VIEW
1 series · 1 of 1 positions shown · non-contrast
Comparison: None.

CLINICAL DATA: Shortness of breath.

EXAM:
PORTABLE CHEST 1 VIEW

[chest ap]
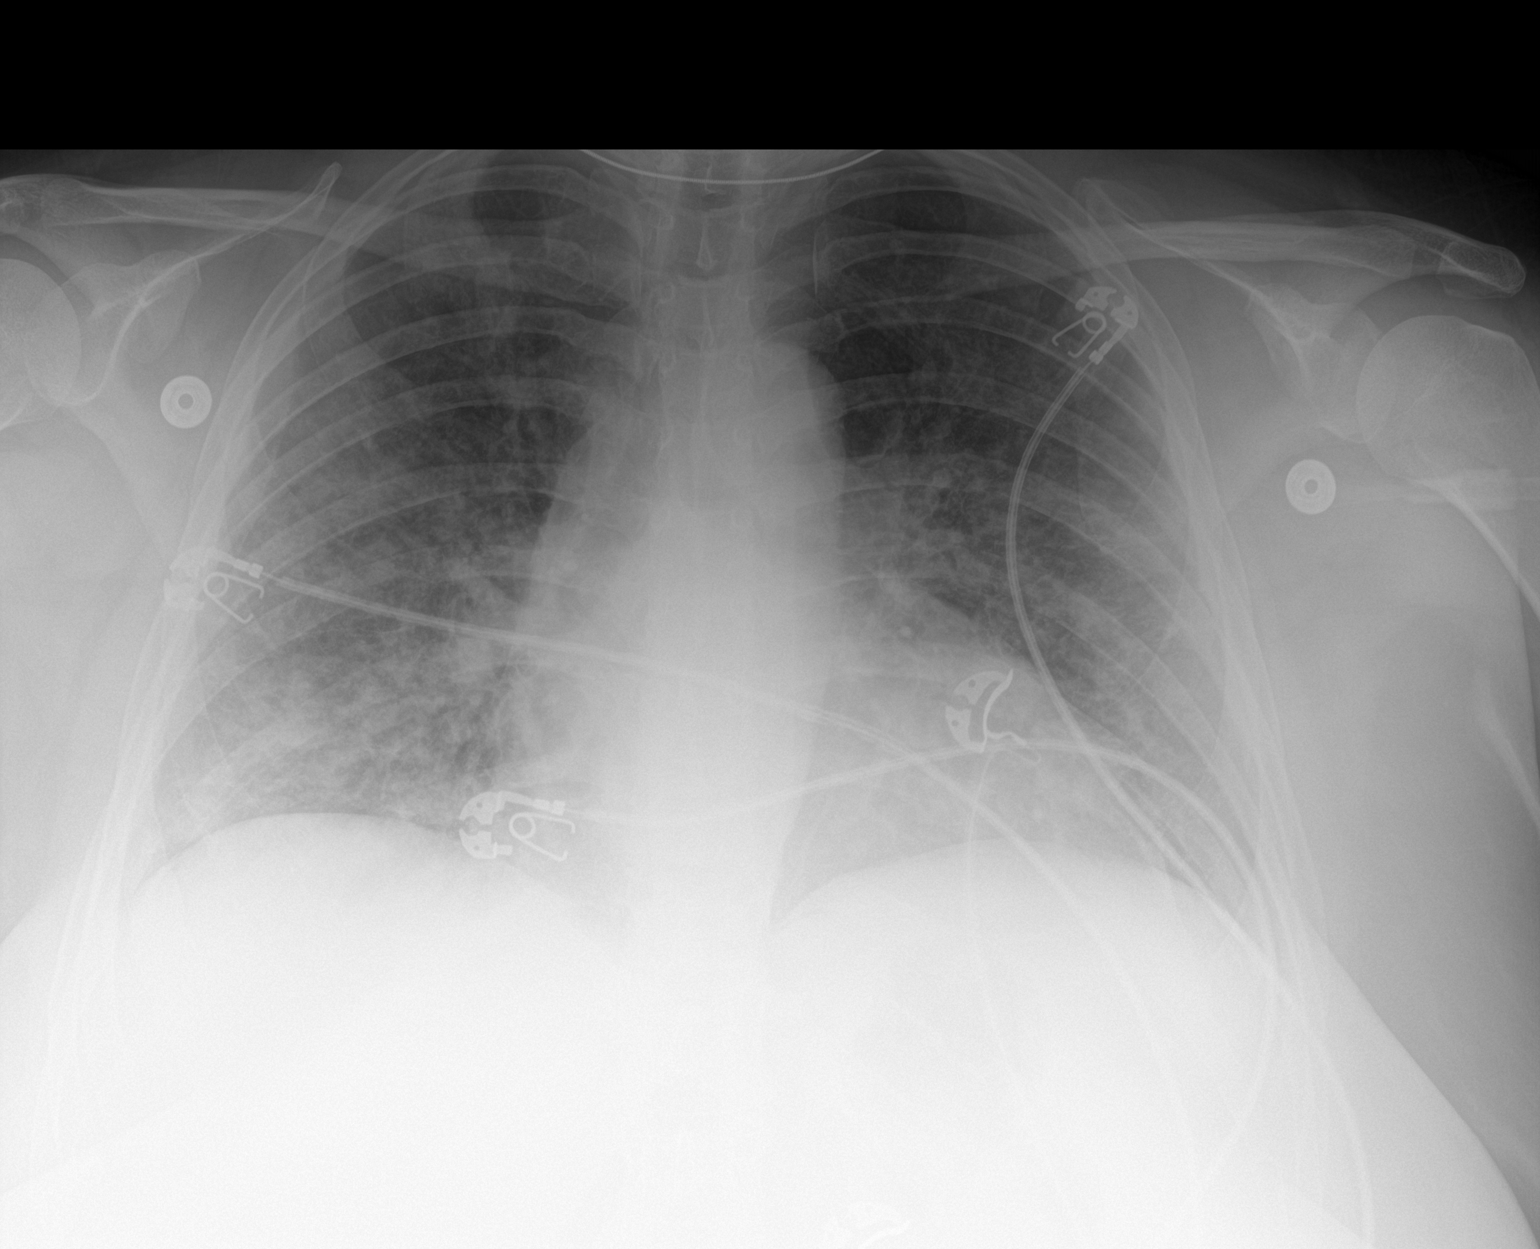

[1 of 1 positions shown; findings below may reference images not displayed]

FINDINGS: The heart size and mediastinal contours are within normal limits. No
pneumothorax or pleural effusion is noted. Bilateral perihilar and
basilar airspace opacities are noted concerning for pneumonia. Right
upper lobe airspace opacity is noted as well. The visualized
skeletal structures are unremarkable.
IMPRESSION: Bilateral lung opacities are noted, right greater than left, most
consistent with multifocal pneumonia.

## 2021-06-05 ENCOUNTER — Encounter: Payer: Self-pay | Admitting: Cardiology

## 2021-06-10 ENCOUNTER — Other Ambulatory Visit: Payer: Self-pay | Admitting: Cardiology

## 2021-06-10 ENCOUNTER — Other Ambulatory Visit (HOSPITAL_COMMUNITY): Payer: Self-pay

## 2021-06-10 MED ORDER — LISINOPRIL 10 MG PO TABS
10.0000 mg | ORAL_TABLET | Freq: Every day | ORAL | 1 refills | Status: DC
  Filled 2021-06-10: qty 30, 30d supply, fill #0
  Filled 2021-06-17: qty 60, 60d supply, fill #0

## 2021-06-17 ENCOUNTER — Other Ambulatory Visit (HOSPITAL_COMMUNITY): Payer: Self-pay

## 2021-07-10 ENCOUNTER — Other Ambulatory Visit: Payer: Self-pay | Admitting: Cardiology

## 2021-07-11 ENCOUNTER — Other Ambulatory Visit (HOSPITAL_COMMUNITY): Payer: Self-pay

## 2021-07-11 MED ORDER — CARVEDILOL 6.25 MG PO TABS
6.2500 mg | ORAL_TABLET | Freq: Two times a day (BID) | ORAL | 0 refills | Status: DC
  Filled 2021-07-11: qty 60, 30d supply, fill #0

## 2021-08-05 ENCOUNTER — Other Ambulatory Visit (HOSPITAL_COMMUNITY): Payer: Self-pay

## 2021-08-05 DIAGNOSIS — I10 Essential (primary) hypertension: Secondary | ICD-10-CM | POA: Diagnosis not present

## 2021-08-05 DIAGNOSIS — R5383 Other fatigue: Secondary | ICD-10-CM | POA: Diagnosis not present

## 2021-08-05 DIAGNOSIS — F902 Attention-deficit hyperactivity disorder, combined type: Secondary | ICD-10-CM | POA: Diagnosis not present

## 2021-08-05 DIAGNOSIS — E782 Mixed hyperlipidemia: Secondary | ICD-10-CM | POA: Diagnosis not present

## 2021-08-05 DIAGNOSIS — Z0000 Encounter for general adult medical examination without abnormal findings: Secondary | ICD-10-CM | POA: Diagnosis not present

## 2021-08-05 DIAGNOSIS — I4729 Other ventricular tachycardia: Secondary | ICD-10-CM | POA: Diagnosis not present

## 2021-08-05 DIAGNOSIS — R5381 Other malaise: Secondary | ICD-10-CM | POA: Diagnosis not present

## 2021-08-05 DIAGNOSIS — F410 Panic disorder [episodic paroxysmal anxiety] without agoraphobia: Secondary | ICD-10-CM | POA: Diagnosis not present

## 2021-08-05 DIAGNOSIS — F411 Generalized anxiety disorder: Secondary | ICD-10-CM | POA: Diagnosis not present

## 2021-08-05 DIAGNOSIS — F332 Major depressive disorder, recurrent severe without psychotic features: Secondary | ICD-10-CM | POA: Diagnosis not present

## 2021-08-05 MED ORDER — LISINOPRIL 2.5 MG PO TABS
2.5000 mg | ORAL_TABLET | Freq: Every day | ORAL | 3 refills | Status: DC
  Filled 2021-08-05: qty 90, 90d supply, fill #0

## 2021-08-05 MED ORDER — ESCITALOPRAM OXALATE 20 MG PO TABS
20.0000 mg | ORAL_TABLET | Freq: Every day | ORAL | 3 refills | Status: DC
  Filled 2021-08-05: qty 90, 90d supply, fill #0
  Filled 2021-10-29: qty 90, 90d supply, fill #1
  Filled 2021-11-06 – 2022-02-05 (×2): qty 90, 90d supply, fill #2
  Filled 2022-05-01: qty 90, 90d supply, fill #3

## 2021-08-05 MED ORDER — CARVEDILOL 3.125 MG PO TABS
ORAL_TABLET | ORAL | 3 refills | Status: DC
  Filled 2021-08-05: qty 180, 90d supply, fill #0

## 2021-08-05 MED ORDER — LAMOTRIGINE 25 MG PO TABS
ORAL_TABLET | ORAL | 2 refills | Status: DC
  Filled 2021-08-05: qty 60, 15d supply, fill #0
  Filled 2021-08-26: qty 60, 15d supply, fill #1
  Filled 2021-09-16: qty 60, 15d supply, fill #2

## 2021-08-05 MED ORDER — ROSUVASTATIN CALCIUM 10 MG PO TABS
10.0000 mg | ORAL_TABLET | Freq: Every day | ORAL | 3 refills | Status: AC
  Filled 2021-08-05: qty 90, 90d supply, fill #0
  Filled 2021-11-06: qty 90, 90d supply, fill #1

## 2021-08-05 MED ORDER — HYDROCHLOROTHIAZIDE 12.5 MG PO CAPS
ORAL_CAPSULE | ORAL | 3 refills | Status: DC
  Filled 2021-08-05: qty 90, 90d supply, fill #0
  Filled 2021-10-29: qty 90, 90d supply, fill #1
  Filled 2021-11-06 – 2022-02-05 (×2): qty 90, 90d supply, fill #2
  Filled 2022-05-01: qty 90, 90d supply, fill #3

## 2021-08-05 MED ORDER — AMLODIPINE BESYLATE 10 MG PO TABS
ORAL_TABLET | ORAL | 3 refills | Status: AC
  Filled 2021-08-05: qty 90, 90d supply, fill #0

## 2021-08-06 ENCOUNTER — Other Ambulatory Visit (HOSPITAL_COMMUNITY): Payer: Self-pay

## 2021-08-07 ENCOUNTER — Other Ambulatory Visit (HOSPITAL_COMMUNITY): Payer: Self-pay

## 2021-08-09 ENCOUNTER — Other Ambulatory Visit (HOSPITAL_COMMUNITY): Payer: Self-pay

## 2021-08-09 MED ORDER — CARVEDILOL 6.25 MG PO TABS
ORAL_TABLET | ORAL | 3 refills | Status: AC
  Filled 2021-08-09: qty 90, 90d supply, fill #0
  Filled 2021-11-06: qty 90, 90d supply, fill #1

## 2021-08-09 MED ORDER — LISINOPRIL 10 MG PO TABS
10.0000 mg | ORAL_TABLET | Freq: Every day | ORAL | 3 refills | Status: DC
  Filled 2021-08-09: qty 90, 90d supply, fill #0
  Filled 2022-01-06: qty 90, 90d supply, fill #1
  Filled 2022-04-13: qty 90, 90d supply, fill #2
  Filled 2022-07-08 – 2022-07-14 (×3): qty 90, 90d supply, fill #3

## 2021-08-26 ENCOUNTER — Other Ambulatory Visit (HOSPITAL_COMMUNITY): Payer: Self-pay

## 2021-08-30 ENCOUNTER — Other Ambulatory Visit (HOSPITAL_COMMUNITY): Payer: Self-pay

## 2021-09-16 ENCOUNTER — Other Ambulatory Visit (HOSPITAL_COMMUNITY): Payer: Self-pay

## 2021-10-04 ENCOUNTER — Other Ambulatory Visit (HOSPITAL_COMMUNITY): Payer: Self-pay

## 2021-10-05 ENCOUNTER — Other Ambulatory Visit (HOSPITAL_COMMUNITY): Payer: Self-pay

## 2021-10-07 ENCOUNTER — Other Ambulatory Visit (HOSPITAL_COMMUNITY): Payer: Self-pay

## 2021-10-07 MED ORDER — LAMOTRIGINE 25 MG PO TABS
ORAL_TABLET | ORAL | 2 refills | Status: AC
  Filled 2021-10-07: qty 60, 15d supply, fill #0

## 2021-10-17 ENCOUNTER — Other Ambulatory Visit (HOSPITAL_COMMUNITY): Payer: Self-pay

## 2021-10-17 MED ORDER — LAMOTRIGINE 25 MG PO TABS
ORAL_TABLET | ORAL | 11 refills | Status: AC
  Filled 2021-10-17: qty 180, 45d supply, fill #0
  Filled 2021-12-03: qty 180, 45d supply, fill #1

## 2021-10-19 ENCOUNTER — Other Ambulatory Visit (HOSPITAL_COMMUNITY): Payer: Self-pay

## 2021-10-29 ENCOUNTER — Other Ambulatory Visit (HOSPITAL_COMMUNITY): Payer: Self-pay

## 2021-11-06 ENCOUNTER — Other Ambulatory Visit (HOSPITAL_COMMUNITY): Payer: Self-pay

## 2021-12-03 ENCOUNTER — Other Ambulatory Visit (HOSPITAL_COMMUNITY): Payer: Self-pay

## 2022-01-06 ENCOUNTER — Other Ambulatory Visit (HOSPITAL_COMMUNITY): Payer: Self-pay

## 2022-01-06 DIAGNOSIS — R5381 Other malaise: Secondary | ICD-10-CM | POA: Diagnosis not present

## 2022-01-06 DIAGNOSIS — E669 Obesity, unspecified: Secondary | ICD-10-CM | POA: Diagnosis not present

## 2022-01-06 DIAGNOSIS — F332 Major depressive disorder, recurrent severe without psychotic features: Secondary | ICD-10-CM | POA: Diagnosis not present

## 2022-01-06 DIAGNOSIS — I4729 Other ventricular tachycardia: Secondary | ICD-10-CM | POA: Diagnosis not present

## 2022-01-06 DIAGNOSIS — K219 Gastro-esophageal reflux disease without esophagitis: Secondary | ICD-10-CM | POA: Diagnosis not present

## 2022-01-06 DIAGNOSIS — R5383 Other fatigue: Secondary | ICD-10-CM | POA: Diagnosis not present

## 2022-01-06 DIAGNOSIS — F411 Generalized anxiety disorder: Secondary | ICD-10-CM | POA: Diagnosis not present

## 2022-01-06 DIAGNOSIS — E782 Mixed hyperlipidemia: Secondary | ICD-10-CM | POA: Diagnosis not present

## 2022-01-06 DIAGNOSIS — I10 Essential (primary) hypertension: Secondary | ICD-10-CM | POA: Diagnosis not present

## 2022-01-06 MED ORDER — LAMOTRIGINE 100 MG PO TABS
ORAL_TABLET | ORAL | 3 refills | Status: DC
  Filled 2022-01-06: qty 180, 90d supply, fill #0
  Filled 2022-04-13: qty 180, 90d supply, fill #1
  Filled 2022-07-08 – 2022-07-14 (×3): qty 180, 90d supply, fill #2
  Filled 2022-10-08: qty 180, 90d supply, fill #3

## 2022-01-06 MED ORDER — CARVEDILOL 6.25 MG PO TABS
ORAL_TABLET | ORAL | 3 refills | Status: DC
  Filled 2022-01-06 – 2022-11-11 (×2): qty 180, 90d supply, fill #0

## 2022-01-06 MED ORDER — MOUNJARO 2.5 MG/0.5ML ~~LOC~~ SOAJ
SUBCUTANEOUS | 0 refills | Status: DC
  Filled 2022-01-06: qty 2, 28d supply, fill #0

## 2022-01-08 ENCOUNTER — Other Ambulatory Visit (HOSPITAL_COMMUNITY): Payer: Self-pay

## 2022-01-08 MED ORDER — ROSUVASTATIN CALCIUM 20 MG PO TABS
20.0000 mg | ORAL_TABLET | Freq: Every day | ORAL | 3 refills | Status: DC
  Filled 2022-01-08: qty 90, 90d supply, fill #0
  Filled 2022-05-01: qty 90, 90d supply, fill #1
  Filled 2022-07-29 – 2022-08-21 (×2): qty 90, 90d supply, fill #2
  Filled 2022-11-26: qty 90, 90d supply, fill #3

## 2022-01-14 ENCOUNTER — Other Ambulatory Visit (HOSPITAL_COMMUNITY): Payer: Self-pay

## 2022-01-14 MED ORDER — CARVEDILOL 6.25 MG PO TABS
ORAL_TABLET | ORAL | 3 refills | Status: DC
  Filled 2022-01-14: qty 180, 90d supply, fill #0
  Filled 2022-04-13: qty 180, 90d supply, fill #1
  Filled 2022-08-09: qty 180, 90d supply, fill #2

## 2022-01-31 ENCOUNTER — Other Ambulatory Visit (HOSPITAL_COMMUNITY): Payer: Self-pay

## 2022-01-31 MED ORDER — WEGOVY 0.5 MG/0.5ML ~~LOC~~ SOAJ
SUBCUTANEOUS | 0 refills | Status: DC
  Filled 2022-01-31: qty 2, 28d supply, fill #0

## 2022-02-05 ENCOUNTER — Other Ambulatory Visit (HOSPITAL_COMMUNITY): Payer: Self-pay

## 2022-02-08 ENCOUNTER — Other Ambulatory Visit (HOSPITAL_COMMUNITY): Payer: Self-pay

## 2022-02-12 ENCOUNTER — Other Ambulatory Visit (HOSPITAL_COMMUNITY): Payer: Self-pay

## 2022-03-06 ENCOUNTER — Other Ambulatory Visit (HOSPITAL_COMMUNITY): Payer: Self-pay

## 2022-03-06 MED ORDER — WEGOVY 0.5 MG/0.5ML ~~LOC~~ SOAJ
SUBCUTANEOUS | 0 refills | Status: AC
  Filled 2022-03-06: qty 2, 28d supply, fill #0

## 2022-03-13 ENCOUNTER — Other Ambulatory Visit (HOSPITAL_COMMUNITY): Payer: Self-pay

## 2022-04-08 ENCOUNTER — Other Ambulatory Visit (HOSPITAL_COMMUNITY): Payer: Self-pay

## 2022-04-08 MED ORDER — WEGOVY 1 MG/0.5ML ~~LOC~~ SOAJ
1.0000 mg | SUBCUTANEOUS | 0 refills | Status: DC
  Filled 2022-04-08: qty 2, 28d supply, fill #0

## 2022-04-14 ENCOUNTER — Other Ambulatory Visit (HOSPITAL_COMMUNITY): Payer: Self-pay

## 2022-04-18 DIAGNOSIS — E782 Mixed hyperlipidemia: Secondary | ICD-10-CM | POA: Diagnosis not present

## 2022-04-23 ENCOUNTER — Other Ambulatory Visit (HOSPITAL_COMMUNITY): Payer: Self-pay

## 2022-05-01 ENCOUNTER — Other Ambulatory Visit (HOSPITAL_COMMUNITY): Payer: Self-pay

## 2022-05-06 ENCOUNTER — Other Ambulatory Visit (HOSPITAL_COMMUNITY): Payer: Self-pay

## 2022-05-06 MED ORDER — WEGOVY 1 MG/0.5ML ~~LOC~~ SOAJ
1.0000 mg | SUBCUTANEOUS | 0 refills | Status: AC
  Filled 2022-05-06: qty 2, 28d supply, fill #0

## 2022-05-14 ENCOUNTER — Other Ambulatory Visit (HOSPITAL_COMMUNITY): Payer: Self-pay

## 2022-05-15 ENCOUNTER — Other Ambulatory Visit (HOSPITAL_COMMUNITY): Payer: Self-pay

## 2022-05-21 ENCOUNTER — Other Ambulatory Visit (HOSPITAL_COMMUNITY): Payer: Self-pay

## 2022-05-24 ENCOUNTER — Other Ambulatory Visit (HOSPITAL_COMMUNITY): Payer: Self-pay

## 2022-05-27 ENCOUNTER — Other Ambulatory Visit (HOSPITAL_COMMUNITY): Payer: Self-pay

## 2022-06-23 ENCOUNTER — Other Ambulatory Visit: Payer: Self-pay

## 2022-06-23 ENCOUNTER — Other Ambulatory Visit (HOSPITAL_COMMUNITY): Payer: Self-pay

## 2022-06-23 MED ORDER — WEGOVY 1.7 MG/0.75ML ~~LOC~~ SOAJ
1.7000 mg | SUBCUTANEOUS | 0 refills | Status: DC
  Filled 2022-06-23: qty 3, 28d supply, fill #0

## 2022-07-01 ENCOUNTER — Other Ambulatory Visit: Payer: Self-pay

## 2022-07-01 ENCOUNTER — Other Ambulatory Visit (HOSPITAL_COMMUNITY): Payer: Self-pay

## 2022-07-08 ENCOUNTER — Other Ambulatory Visit (HOSPITAL_COMMUNITY): Payer: Self-pay

## 2022-07-29 ENCOUNTER — Other Ambulatory Visit (HOSPITAL_COMMUNITY): Payer: Self-pay

## 2022-07-29 ENCOUNTER — Other Ambulatory Visit: Payer: Self-pay

## 2022-07-31 ENCOUNTER — Other Ambulatory Visit: Payer: Self-pay

## 2022-07-31 ENCOUNTER — Other Ambulatory Visit (HOSPITAL_COMMUNITY): Payer: Self-pay

## 2022-07-31 MED ORDER — HYDROCHLOROTHIAZIDE 12.5 MG PO CAPS
12.5000 mg | ORAL_CAPSULE | Freq: Every morning | ORAL | 0 refills | Status: DC
  Filled 2022-07-31: qty 90, 90d supply, fill #0

## 2022-07-31 MED ORDER — ESCITALOPRAM OXALATE 20 MG PO TABS
20.0000 mg | ORAL_TABLET | Freq: Every day | ORAL | 0 refills | Status: DC
  Filled 2022-07-31: qty 90, 90d supply, fill #0

## 2022-08-07 DIAGNOSIS — Z0000 Encounter for general adult medical examination without abnormal findings: Secondary | ICD-10-CM | POA: Diagnosis not present

## 2022-08-07 DIAGNOSIS — Z1211 Encounter for screening for malignant neoplasm of colon: Secondary | ICD-10-CM | POA: Diagnosis not present

## 2022-08-09 ENCOUNTER — Other Ambulatory Visit (HOSPITAL_COMMUNITY): Payer: Self-pay

## 2022-08-09 ENCOUNTER — Other Ambulatory Visit: Payer: Self-pay

## 2022-08-21 ENCOUNTER — Other Ambulatory Visit: Payer: Self-pay

## 2022-08-21 ENCOUNTER — Other Ambulatory Visit (HOSPITAL_COMMUNITY): Payer: Self-pay

## 2022-08-24 ENCOUNTER — Other Ambulatory Visit (HOSPITAL_COMMUNITY): Payer: Self-pay

## 2022-08-26 ENCOUNTER — Other Ambulatory Visit (HOSPITAL_COMMUNITY): Payer: Self-pay

## 2022-08-27 ENCOUNTER — Other Ambulatory Visit (HOSPITAL_COMMUNITY): Payer: Self-pay

## 2022-08-28 ENCOUNTER — Other Ambulatory Visit (HOSPITAL_COMMUNITY): Payer: Self-pay

## 2022-08-28 MED ORDER — WEGOVY 1.7 MG/0.75ML ~~LOC~~ SOAJ
1.7000 mg | SUBCUTANEOUS | 5 refills | Status: AC
  Filled 2022-08-28 – 2022-09-06 (×2): qty 3, 28d supply, fill #0
  Filled 2022-10-03: qty 3, 28d supply, fill #1
  Filled 2022-11-01: qty 3, 28d supply, fill #2

## 2022-09-01 ENCOUNTER — Other Ambulatory Visit (HOSPITAL_COMMUNITY): Payer: Self-pay

## 2022-09-01 MED ORDER — HYDROCHLOROTHIAZIDE 12.5 MG PO CAPS
12.5000 mg | ORAL_CAPSULE | Freq: Every morning | ORAL | 3 refills | Status: DC
  Filled 2022-09-06 – 2022-09-22 (×2): qty 90, 90d supply, fill #0
  Filled 2022-12-18: qty 90, 90d supply, fill #1
  Filled 2023-05-18: qty 90, 90d supply, fill #2

## 2022-09-01 MED ORDER — ESCITALOPRAM OXALATE 20 MG PO TABS
20.0000 mg | ORAL_TABLET | Freq: Every day | ORAL | 3 refills | Status: DC
  Filled 2022-09-01 – 2022-09-22 (×2): qty 90, 90d supply, fill #0
  Filled 2023-01-06: qty 90, 90d supply, fill #1
  Filled 2023-05-18: qty 90, 90d supply, fill #2

## 2022-09-06 ENCOUNTER — Other Ambulatory Visit (HOSPITAL_COMMUNITY): Payer: Self-pay

## 2022-09-08 ENCOUNTER — Other Ambulatory Visit (HOSPITAL_COMMUNITY): Payer: Self-pay

## 2022-09-08 ENCOUNTER — Other Ambulatory Visit: Payer: Self-pay

## 2022-09-10 ENCOUNTER — Other Ambulatory Visit (HOSPITAL_COMMUNITY): Payer: Self-pay

## 2022-09-22 ENCOUNTER — Other Ambulatory Visit (HOSPITAL_COMMUNITY): Payer: Self-pay

## 2022-10-03 ENCOUNTER — Other Ambulatory Visit (HOSPITAL_COMMUNITY): Payer: Self-pay

## 2022-10-06 ENCOUNTER — Other Ambulatory Visit (HOSPITAL_COMMUNITY): Payer: Self-pay

## 2022-10-08 ENCOUNTER — Other Ambulatory Visit (HOSPITAL_COMMUNITY): Payer: Self-pay

## 2022-10-08 ENCOUNTER — Other Ambulatory Visit: Payer: Self-pay

## 2022-10-09 ENCOUNTER — Other Ambulatory Visit (HOSPITAL_COMMUNITY): Payer: Self-pay

## 2022-10-09 ENCOUNTER — Other Ambulatory Visit: Payer: Self-pay

## 2022-10-09 MED ORDER — LISINOPRIL 10 MG PO TABS
10.0000 mg | ORAL_TABLET | Freq: Every day | ORAL | 1 refills | Status: DC
  Filled 2022-10-09 (×2): qty 90, 90d supply, fill #0
  Filled 2023-01-06: qty 90, 90d supply, fill #1

## 2022-10-29 DIAGNOSIS — Z1211 Encounter for screening for malignant neoplasm of colon: Secondary | ICD-10-CM | POA: Diagnosis not present

## 2022-10-29 DIAGNOSIS — Z1212 Encounter for screening for malignant neoplasm of rectum: Secondary | ICD-10-CM | POA: Diagnosis not present

## 2022-11-03 ENCOUNTER — Other Ambulatory Visit: Payer: Self-pay

## 2022-11-05 LAB — COLOGUARD: COLOGUARD: NEGATIVE

## 2022-11-05 LAB — EXTERNAL GENERIC LAB PROCEDURE: COLOGUARD: NEGATIVE

## 2022-11-11 ENCOUNTER — Other Ambulatory Visit: Payer: Self-pay

## 2023-01-06 ENCOUNTER — Ambulatory Visit: Payer: Commercial Managed Care - PPO | Admitting: Podiatry

## 2023-01-06 ENCOUNTER — Other Ambulatory Visit (HOSPITAL_COMMUNITY): Payer: Self-pay

## 2023-01-06 ENCOUNTER — Other Ambulatory Visit: Payer: Self-pay

## 2023-01-06 MED ORDER — LAMOTRIGINE 100 MG PO TABS
100.0000 mg | ORAL_TABLET | Freq: Two times a day (BID) | ORAL | 0 refills | Status: DC
  Filled 2023-01-06: qty 180, 90d supply, fill #0

## 2023-02-05 ENCOUNTER — Other Ambulatory Visit (HOSPITAL_COMMUNITY): Payer: Self-pay

## 2023-02-05 DIAGNOSIS — F332 Major depressive disorder, recurrent severe without psychotic features: Secondary | ICD-10-CM | POA: Diagnosis not present

## 2023-02-05 DIAGNOSIS — F411 Generalized anxiety disorder: Secondary | ICD-10-CM | POA: Diagnosis not present

## 2023-02-05 DIAGNOSIS — I4729 Other ventricular tachycardia: Secondary | ICD-10-CM | POA: Diagnosis not present

## 2023-02-05 DIAGNOSIS — I10 Essential (primary) hypertension: Secondary | ICD-10-CM | POA: Diagnosis not present

## 2023-02-05 DIAGNOSIS — R5383 Other fatigue: Secondary | ICD-10-CM | POA: Diagnosis not present

## 2023-02-05 DIAGNOSIS — E782 Mixed hyperlipidemia: Secondary | ICD-10-CM | POA: Diagnosis not present

## 2023-02-05 DIAGNOSIS — E669 Obesity, unspecified: Secondary | ICD-10-CM | POA: Diagnosis not present

## 2023-02-05 DIAGNOSIS — R5381 Other malaise: Secondary | ICD-10-CM | POA: Diagnosis not present

## 2023-02-05 DIAGNOSIS — F410 Panic disorder [episodic paroxysmal anxiety] without agoraphobia: Secondary | ICD-10-CM | POA: Diagnosis not present

## 2023-02-05 MED ORDER — CARVEDILOL 6.25 MG PO TABS
ORAL_TABLET | ORAL | 3 refills | Status: DC
  Filled 2023-02-05 – 2023-02-09 (×2): qty 180, 90d supply, fill #0
  Filled 2023-05-18: qty 180, 90d supply, fill #1
  Filled 2023-09-12 – 2023-09-14 (×3): qty 180, 90d supply, fill #2
  Filled 2023-12-21: qty 180, 90d supply, fill #3

## 2023-02-09 ENCOUNTER — Other Ambulatory Visit (HOSPITAL_COMMUNITY): Payer: Self-pay

## 2023-02-10 ENCOUNTER — Other Ambulatory Visit: Payer: Self-pay

## 2023-02-17 ENCOUNTER — Other Ambulatory Visit (HOSPITAL_COMMUNITY): Payer: Self-pay

## 2023-02-18 ENCOUNTER — Other Ambulatory Visit (HOSPITAL_COMMUNITY): Payer: Self-pay

## 2023-02-18 MED ORDER — ROSUVASTATIN CALCIUM 20 MG PO TABS
20.0000 mg | ORAL_TABLET | Freq: Every day | ORAL | 1 refills | Status: DC
  Filled 2023-02-18 – 2023-03-10 (×2): qty 90, 90d supply, fill #0
  Filled 2023-07-16: qty 90, 90d supply, fill #1

## 2023-02-28 ENCOUNTER — Other Ambulatory Visit (HOSPITAL_COMMUNITY): Payer: Self-pay

## 2023-03-10 ENCOUNTER — Other Ambulatory Visit (HOSPITAL_COMMUNITY): Payer: Self-pay

## 2023-03-10 ENCOUNTER — Other Ambulatory Visit: Payer: Self-pay

## 2023-05-18 ENCOUNTER — Other Ambulatory Visit: Payer: Self-pay

## 2023-05-18 ENCOUNTER — Other Ambulatory Visit (HOSPITAL_COMMUNITY): Payer: Self-pay

## 2023-05-19 ENCOUNTER — Other Ambulatory Visit (HOSPITAL_COMMUNITY): Payer: Self-pay

## 2023-05-19 MED ORDER — LAMOTRIGINE 100 MG PO TABS
100.0000 mg | ORAL_TABLET | Freq: Two times a day (BID) | ORAL | 0 refills | Status: DC
  Filled 2023-05-19: qty 180, 90d supply, fill #0

## 2023-05-19 MED ORDER — LISINOPRIL 10 MG PO TABS
10.0000 mg | ORAL_TABLET | Freq: Every day | ORAL | 0 refills | Status: DC
  Filled 2023-05-19: qty 90, 90d supply, fill #0

## 2023-05-22 ENCOUNTER — Other Ambulatory Visit (HOSPITAL_COMMUNITY): Payer: Self-pay

## 2023-06-18 ENCOUNTER — Telehealth: Payer: Commercial Managed Care - PPO | Admitting: Nurse Practitioner

## 2023-06-18 DIAGNOSIS — M545 Low back pain, unspecified: Secondary | ICD-10-CM | POA: Diagnosis not present

## 2023-06-18 MED ORDER — CYCLOBENZAPRINE HCL 10 MG PO TABS: 10.0000 mg | ORAL_TABLET | Freq: Three times a day (TID) | ORAL | 0 refills | Status: AC | PRN

## 2023-06-18 MED ORDER — PREDNISONE 20 MG PO TABS: 20.0000 mg | ORAL_TABLET | Freq: Two times a day (BID) | ORAL | 0 refills | Status: AC
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
## 2023-09-02 DIAGNOSIS — Z0000 Encounter for general adult medical examination without abnormal findings: Secondary | ICD-10-CM | POA: Diagnosis not present

## 2023-09-02 DIAGNOSIS — Z1231 Encounter for screening mammogram for malignant neoplasm of breast: Secondary | ICD-10-CM | POA: Diagnosis not present

## 2023-09-12 ENCOUNTER — Other Ambulatory Visit (HOSPITAL_COMMUNITY): Payer: Self-pay

## 2023-09-14 ENCOUNTER — Other Ambulatory Visit: Payer: Self-pay

## 2023-09-14 ENCOUNTER — Other Ambulatory Visit (HOSPITAL_COMMUNITY): Payer: Self-pay

## 2023-09-14 MED ORDER — LISINOPRIL 10 MG PO TABS
10.0000 mg | ORAL_TABLET | Freq: Every day | ORAL | 1 refills | Status: DC
  Filled 2023-09-14: qty 90, 90d supply, fill #0
  Filled 2023-12-21: qty 90, 90d supply, fill #1

## 2023-09-14 MED ORDER — ESCITALOPRAM OXALATE 20 MG PO TABS
20.0000 mg | ORAL_TABLET | Freq: Every day | ORAL | 1 refills | Status: DC
  Filled 2023-09-14: qty 90, 90d supply, fill #0
  Filled 2023-12-21: qty 90, 90d supply, fill #1

## 2023-09-14 MED ORDER — HYDROCHLOROTHIAZIDE 12.5 MG PO CAPS
12.5000 mg | ORAL_CAPSULE | Freq: Every morning | ORAL | 1 refills | Status: DC
  Filled 2023-09-14: qty 90, 90d supply, fill #0
  Filled 2023-12-21: qty 90, 90d supply, fill #1

## 2023-09-14 MED ORDER — LAMOTRIGINE 100 MG PO TABS
100.0000 mg | ORAL_TABLET | Freq: Two times a day (BID) | ORAL | 1 refills | Status: DC
  Filled 2023-09-14: qty 180, 90d supply, fill #0
  Filled 2023-12-21: qty 180, 90d supply, fill #1

## 2023-10-30 ENCOUNTER — Other Ambulatory Visit (HOSPITAL_COMMUNITY): Payer: Self-pay

## 2023-10-30 ENCOUNTER — Other Ambulatory Visit: Payer: Self-pay

## 2023-10-30 MED ORDER — ROSUVASTATIN CALCIUM 20 MG PO TABS
20.0000 mg | ORAL_TABLET | Freq: Every day | ORAL | 1 refills | Status: DC
  Filled 2023-10-30: qty 90, 90d supply, fill #0
  Filled 2023-12-21 – 2024-02-05 (×2): qty 90, 90d supply, fill #1

## 2023-11-12 ENCOUNTER — Ambulatory Visit: Admitting: Podiatry

## 2023-11-12 DIAGNOSIS — M722 Plantar fascial fibromatosis: Secondary | ICD-10-CM

## 2023-11-12 DIAGNOSIS — L84 Corns and callosities: Secondary | ICD-10-CM

## 2023-11-12 DIAGNOSIS — M7732 Calcaneal spur, left foot: Secondary | ICD-10-CM

## 2023-11-12 NOTE — Progress Notes (Signed)
 Chief Complaint  Patient presents with   Plantar Fasciitis    Here today for PF to the left foot. He has seen Dr. Shellye Dibble, it got better on its own last time. Now, she is having more pain. 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 She has a cruse scheduled for June and would like to not be limping. Not diabetic and no anti coag. Pain is mostly in the heel.    HPI: 48 y.o. female presenting today with c/o pain in the bottom of the left heel.  Pain is rated as 8/10.  She states her pain is mostly in the central part of the plantar heel and the plantar lateral part of the plantar heel.  She was seen by Dr. Marlow Sin for this in the past and noted that 2 previous cortisone injections resolved her pain completely.  She is a Engineer, civil (consulting).  She is wearing [employer] sneakers today.  She also notes a small callus along the instep of her left foot that started a couple of weeks ago.  Past Medical History:  Diagnosis Date   Annual physical exam 03/27/2017   Attention deficit hyperactivity disorder (ADHD), combined type 04/06/2017   COVID-19 virus infection 12/30/2018   Depression    Heart palpitations 07/28/2019   High blood cholesterol level    Hyperlipidemia 10/27/2011   01/2012 Total  236 LDL  149 HDL  69 Trig  88 07/6107 Total  236 LDL  149 HDL  69 Trig  88   Hypertension    Hypertension, essential, benign 01/05/2019   Malaise and fatigue 07/28/2019   Obesity with body mass index (BMI) of 30.0 to 39.9 03/27/2017   Severe episode of recurrent major depressive disorder, without psychotic features (HCC) 03/27/2017    Past Surgical History:  Procedure Laterality Date   LAPAROSCOPIC OVARIAN Left     No Known Allergies   Physical Exam: General: The patient is alert and oriented x3 in no acute distress.  Dermatology:  No ecchymosis, erythema, or edema bilateral.  No open lesions.    Vascular: Palpable pedal pulses bilaterally. Capillary refill within normal  limits.  No appreciable edema.    Neurological: Light touch sensation intact bilateral.  MMT 5/5 to lower extremity bilateral. Negative Tinel's sign with percussion of the posterior tibial nerve on the affected extremity.    Musculoskeletal Exam:  There is pain on palpation of the plantarmedial, plantar lateral & plantarcentral aspect of left heel.  No gaps or nodules within the plantar fascia.  Positive Windlass mechanism bilateral.  Antalgic gait noted with first few steps upon standing.  No pain on palpation of achilles tendon bilateral.  Ankle df less than 10 degrees with knee extended b/l.  Previous left foot x-rays from 2021 revealed an inferior calcaneal spur.  Assessment/Plan of Care: 1. Plantar fasciitis of left foot   2. Callus of foot   3. Inferior calcaneal spur of left foot      -Reviewed etiology of plantar fasciitis with patient.  Discussed treatment options with patient today, including cortisone injection, NSAID course of treatment, stretching exercises, physical therapy, use of night splint, rest, icing the heel, arch supports/orthotics, and supportive shoe gear.    With the patient's verbal consent, a corticosteroid injection was administered to the left heel, consisting of a mixture of 1% lidocaine plain, 0.5% Sensorcaine plain, and Kenalog -10 for a total of 1.5cc administered.  A Band-aid was applied. Pain  level post-injection is 5/10.  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  Stretching exercises were dispensed to the patient at checkout.  Patient was fitted for and dispensed a night splint, which is a static AFO device with a soft interface material, to be worn when sleeping or at times of nonweightbearing.  This is not an ambulatory device.  Insurance waiver was signed for the device today.  We discussed other shoe options for her today including the [employer] or the [employer]  Return if symptoms  worsen or fail to improve.   Joe Murders, DPM, FACFAS [facility]     [address] [place], [place] [address]                Office [phone]  Fax [phone]

## 2023-11-12 NOTE — Patient Instructions (Signed)

## 2023-12-21 DIAGNOSIS — Z6841 Body Mass Index (BMI) 40.0 and over, adult: Secondary | ICD-10-CM | POA: Diagnosis not present

## 2023-12-21 DIAGNOSIS — E66813 Obesity, class 3: Secondary | ICD-10-CM | POA: Diagnosis not present

## 2023-12-22 ENCOUNTER — Other Ambulatory Visit: Payer: Self-pay

## 2024-02-03 DIAGNOSIS — Z6841 Body Mass Index (BMI) 40.0 and over, adult: Secondary | ICD-10-CM | POA: Diagnosis not present

## 2024-02-03 DIAGNOSIS — E66813 Obesity, class 3: Secondary | ICD-10-CM | POA: Diagnosis not present

## 2024-02-05 ENCOUNTER — Other Ambulatory Visit (HOSPITAL_COMMUNITY): Payer: Self-pay

## 2024-03-14 ENCOUNTER — Other Ambulatory Visit (HOSPITAL_COMMUNITY): Payer: Self-pay

## 2024-03-14 MED ORDER — ESCITALOPRAM OXALATE 20 MG PO TABS
20.0000 mg | ORAL_TABLET | Freq: Every day | ORAL | 1 refills | Status: AC
  Filled 2024-03-14: qty 90, 90d supply, fill #0
  Filled 2024-07-08: qty 90, 90d supply, fill #1

## 2024-03-14 MED ORDER — HYDROCHLOROTHIAZIDE 12.5 MG PO CAPS
12.5000 mg | ORAL_CAPSULE | Freq: Every morning | ORAL | 1 refills | Status: AC
  Filled 2024-03-14: qty 90, 90d supply, fill #0
  Filled 2024-07-08: qty 90, 90d supply, fill #1

## 2024-03-14 MED ORDER — CARVEDILOL 6.25 MG PO TABS
6.2500 mg | ORAL_TABLET | Freq: Two times a day (BID) | ORAL | 3 refills | Status: AC
  Filled 2024-03-14: qty 180, 90d supply, fill #0
  Filled 2024-07-08: qty 180, 90d supply, fill #1

## 2024-03-14 MED ORDER — LAMOTRIGINE 100 MG PO TABS
100.0000 mg | ORAL_TABLET | Freq: Two times a day (BID) | ORAL | 1 refills | Status: AC
  Filled 2024-03-14: qty 180, 90d supply, fill #0
  Filled 2024-07-08: qty 180, 90d supply, fill #1

## 2024-03-14 MED ORDER — LISINOPRIL 10 MG PO TABS
10.0000 mg | ORAL_TABLET | Freq: Every day | ORAL | 1 refills | Status: AC
  Filled 2024-03-14: qty 90, 90d supply, fill #0
  Filled 2024-07-08: qty 90, 90d supply, fill #1

## 2024-03-15 ENCOUNTER — Encounter: Payer: Self-pay | Admitting: Pharmacist

## 2024-03-15 ENCOUNTER — Other Ambulatory Visit: Payer: Self-pay

## 2024-03-15 ENCOUNTER — Other Ambulatory Visit (HOSPITAL_COMMUNITY): Payer: Self-pay

## 2024-03-17 DIAGNOSIS — E66813 Obesity, class 3: Secondary | ICD-10-CM | POA: Diagnosis not present

## 2024-03-17 DIAGNOSIS — Z6841 Body Mass Index (BMI) 40.0 and over, adult: Secondary | ICD-10-CM | POA: Diagnosis not present

## 2024-03-30 DIAGNOSIS — I10 Essential (primary) hypertension: Secondary | ICD-10-CM | POA: Diagnosis not present

## 2024-03-30 DIAGNOSIS — F902 Attention-deficit hyperactivity disorder, combined type: Secondary | ICD-10-CM | POA: Diagnosis not present

## 2024-03-30 DIAGNOSIS — R5381 Other malaise: Secondary | ICD-10-CM | POA: Diagnosis not present

## 2024-03-30 DIAGNOSIS — E782 Mixed hyperlipidemia: Secondary | ICD-10-CM | POA: Diagnosis not present

## 2024-03-30 DIAGNOSIS — F332 Major depressive disorder, recurrent severe without psychotic features: Secondary | ICD-10-CM | POA: Diagnosis not present

## 2024-03-30 DIAGNOSIS — F411 Generalized anxiety disorder: Secondary | ICD-10-CM | POA: Diagnosis not present

## 2024-03-30 DIAGNOSIS — F410 Panic disorder [episodic paroxysmal anxiety] without agoraphobia: Secondary | ICD-10-CM | POA: Diagnosis not present

## 2024-03-30 DIAGNOSIS — R5383 Other fatigue: Secondary | ICD-10-CM | POA: Diagnosis not present

## 2024-03-30 DIAGNOSIS — I4729 Other ventricular tachycardia: Secondary | ICD-10-CM | POA: Diagnosis not present

## 2024-04-06 ENCOUNTER — Other Ambulatory Visit: Payer: Self-pay

## 2024-04-14 DIAGNOSIS — E66813 Obesity, class 3: Secondary | ICD-10-CM | POA: Diagnosis not present

## 2024-04-14 DIAGNOSIS — Z6841 Body Mass Index (BMI) 40.0 and over, adult: Secondary | ICD-10-CM | POA: Diagnosis not present

## 2024-05-10 DIAGNOSIS — Z6841 Body Mass Index (BMI) 40.0 and over, adult: Secondary | ICD-10-CM | POA: Diagnosis not present

## 2024-05-10 DIAGNOSIS — E66813 Obesity, class 3: Secondary | ICD-10-CM | POA: Diagnosis not present

## 2024-05-13 ENCOUNTER — Other Ambulatory Visit (HOSPITAL_COMMUNITY): Payer: Self-pay

## 2024-05-16 ENCOUNTER — Other Ambulatory Visit (HOSPITAL_COMMUNITY): Payer: Self-pay

## 2024-05-16 ENCOUNTER — Other Ambulatory Visit: Payer: Self-pay

## 2024-05-16 MED ORDER — ROSUVASTATIN CALCIUM 20 MG PO TABS
20.0000 mg | ORAL_TABLET | Freq: Every day | ORAL | 1 refills | Status: AC
  Filled 2024-05-16: qty 90, 90d supply, fill #0
  Filled 2024-08-10: qty 90, 90d supply, fill #1

## 2024-06-07 DIAGNOSIS — E66813 Obesity, class 3: Secondary | ICD-10-CM | POA: Diagnosis not present

## 2024-06-07 DIAGNOSIS — Z6841 Body Mass Index (BMI) 40.0 and over, adult: Secondary | ICD-10-CM | POA: Diagnosis not present

## 2024-07-08 ENCOUNTER — Other Ambulatory Visit (HOSPITAL_COMMUNITY): Payer: Self-pay

## 2024-07-08 ENCOUNTER — Other Ambulatory Visit: Payer: Self-pay

## 2024-07-11 ENCOUNTER — Encounter: Payer: Self-pay | Admitting: Pharmacist

## 2024-07-11 ENCOUNTER — Other Ambulatory Visit (HOSPITAL_COMMUNITY): Payer: Self-pay

## 2024-07-11 ENCOUNTER — Other Ambulatory Visit: Payer: Self-pay

## 2024-08-11 ENCOUNTER — Other Ambulatory Visit (HOSPITAL_BASED_OUTPATIENT_CLINIC_OR_DEPARTMENT_OTHER): Payer: Self-pay

## 2024-09-05 ENCOUNTER — Ambulatory Visit: Admitting: Neurology
